# Patient Record
Sex: Female | Born: 1950
Health system: Southern US, Community
[De-identification: ages and names within clinical notes are randomized; demographics above are authoritative.]

## PROBLEM LIST (undated history)

## (undated) DIAGNOSIS — F32A Depression, unspecified: Secondary | ICD-10-CM

## (undated) DIAGNOSIS — E119 Type 2 diabetes mellitus without complications: Secondary | ICD-10-CM

## (undated) DIAGNOSIS — F419 Anxiety disorder, unspecified: Secondary | ICD-10-CM

## (undated) DIAGNOSIS — I10 Essential (primary) hypertension: Secondary | ICD-10-CM

## (undated) DIAGNOSIS — E785 Hyperlipidemia, unspecified: Secondary | ICD-10-CM

## (undated) HISTORY — PX: TUBAL LIGATION: SHX77

## (undated) HISTORY — PX: EYE SURGERY: SHX253

## (undated) HISTORY — PX: CARPAL TUNNEL RELEASE: SHX101

---

## 1998-01-10 ENCOUNTER — Emergency Department (HOSPITAL_COMMUNITY): Admission: EM | Admit: 1998-01-10 | Discharge: 1998-01-10 | Payer: Self-pay | Admitting: Emergency Medicine

## 2009-12-08 ENCOUNTER — Emergency Department (HOSPITAL_COMMUNITY): Admission: EM | Admit: 2009-12-08 | Discharge: 2009-12-08 | Payer: Self-pay | Admitting: Emergency Medicine

## 2020-01-22 ENCOUNTER — Other Ambulatory Visit: Payer: Self-pay | Admitting: Physician Assistant

## 2020-01-22 ENCOUNTER — Ambulatory Visit
Admission: RE | Admit: 2020-01-22 | Discharge: 2020-01-22 | Disposition: A | Discharge status: Home / Self Care | Payer: Medicare HMO | Source: Ambulatory Visit | Attending: Physician Assistant | Admitting: Physician Assistant

## 2020-01-22 ENCOUNTER — Other Ambulatory Visit: Payer: Self-pay

## 2020-01-22 DIAGNOSIS — J387 Other diseases of larynx: Secondary | ICD-10-CM

## 2020-01-22 MED ORDER — IOPAMIDOL (ISOVUE-300) INJECTION 61%
75.0000 mL | Freq: Once | INTRAVENOUS | Status: AC | PRN
  Administered 2020-01-22: 75 mL via INTRAVENOUS

## 2020-01-27 ENCOUNTER — Encounter (HOSPITAL_COMMUNITY): Payer: Self-pay | Admitting: Otolaryngology

## 2020-01-27 ENCOUNTER — Other Ambulatory Visit (HOSPITAL_COMMUNITY)
Admission: RE | Admit: 2020-01-27 | Discharge: 2020-01-27 | Disposition: A | Discharge status: Home / Self Care | Payer: Medicare HMO | Source: Ambulatory Visit | Attending: Otolaryngology | Admitting: Otolaryngology

## 2020-01-27 DIAGNOSIS — Z01812 Encounter for preprocedural laboratory examination: Secondary | ICD-10-CM | POA: Insufficient documentation

## 2020-01-27 DIAGNOSIS — Z20822 Contact with and (suspected) exposure to covid-19: Secondary | ICD-10-CM | POA: Diagnosis not present

## 2020-01-27 LAB — SARS CORONAVIRUS 2 (TAT 6-24 HRS): SARS Coronavirus 2: NEGATIVE

## 2020-01-27 NOTE — Progress Notes (Signed)
PCP - Dr Anastasia Pall Cardiologist - n/a  Chest x-ray - n/a EKG - DOS 01/28/20 Stress Test - n/a ECHO - n/a Cardiac Cath - n/a  Fasting Blood Sugar - Unknown Checks Blood Sugar ___0__ times a day  DM 2 - Patient denies this DX.  STOP now taking any Aspirin (unless otherwise instructed by your surgeon), Aleve, Naproxen, Ibuprofen, Motrin, Advil, Goody's, BC's, all herbal medications, fish oil, and all vitamins.   Coronavirus Screening Covid test scheduled 01/27/20 Do you have any of the following symptoms:  Cough yes/no: No Fever (>100.1F)  yes/no: No Runny nose yes/no: No Sore throat yes/no: No Difficulty breathing/shortness of breath  yes/no: No  Have you traveled in the last 14 days and where? yes/no: No  Patient verbalized understanding of instructions that were given via phone.

## 2020-01-27 NOTE — H&P (Signed)
HPI:   Chief Complaint  Patient presents with  . Sore Throat  Patient is here for sore throat since april 2021.   Sara Farmer is a 69 y.o. female who presents as a new patient for sore throat. Sore throat began four months ago as a new problem; no antecedent event she can recall. Course is stable. Medical therapy has included Nystatin mouth wash and Diflucan for oral thrush. She developed sinus pressure a few weeks ago and was prescribed a course of Doxycycline. Symptoms improved marginally but have not resolved. She did see Dr. Jannifer Franklin with [facility]; cannot recall the medication that was prescribed which she did take (no improvement). She has discomfort in her throat with swallowing but no dysphagia, oral regurgitation, chronic cough or hemoptysis. She reports in recent weeks becoming hoarse. For years has experienced regular mucous feeling in her throat and excessive throat clearing. Her ears are also hurting; fluctuating from one side to the other. She denies a history of heartburn or indigestion. No recent imaging of head or neck.  PMH of type II DM, non-insulin dependent. Currently not on medical therapy. She actually denies that she is a diabetic. Her last A1c was 6.9% on 12/22/19. She rarely uses her Albuterol inhaler; prescribed in January 2021 for acute bacterial bronchitis. No steroid inhalers.   No PMH of stroke. No anticoagulants. No bleeding disorder or history of adverse reaction to anesthesia.  Current smoker (60 year history; has not smoked 2ppd in at least one year; now about 10 cigarettes per day). Drinks about 4-5 caffeinated beverages per day.  PMH/Meds/All/SocHx/FamHx/ROS:   Past Medical History:  Diagnosis Date  . High cholesterol  . Hypertension   Past Surgical History:  Procedure Laterality Date  . CARPAL TUNNEL RELEASE Bilateral   No family history of bleeding disorders, wound healing problems or difficulty with anesthesia.   Social History   Socioeconomic  History  . Marital status: Married  Spouse name: Not on file  . Number of children: Not on file  . Years of education: Not on file  . Highest education level: Not on file  Occupational History  . Not on file  Tobacco Use  . Smoking status: Current Every Day Smoker  Packs/day: 0.50  Types: Cigarettes  . Smokeless tobacco: Never Used  Substance and Sexual Activity  . Alcohol use: Not Currently  . Drug use: Not on file  . Sexual activity: Not on file  Other Topics Concern  . Not on file  Social History Narrative  . Not on file   Social Determinants of Health   Financial Resource Strain:  . Difficulty of Paying Living Expenses:  Food Insecurity:  . Worried About Charity fundraiser in the Last Year:  . Arboriculturist in the Last Year:  Transportation Needs:  . Film/video editor (Medical):  Marland Kitchen Lack of Transportation (Non-Medical):  Physical Activity:  . Days of Exercise per Week:  . Minutes of Exercise per Session:  Stress:  . Feeling of Stress :  Social Connections:  . Frequency of Communication with Friends and Family:  . Frequency of Social Gatherings with Friends and Family:  . Attends Religious Services:  . Active Member of Clubs or Organizations:  . Attends Archivist Meetings:  Marland Kitchen Marital Status:   Current Outpatient Medications:  . albuterol 90 mcg/actuation inhaler, Inhale into the lungs., Disp: , Rfl:  . amLODIPine (NORVASC) 10 MG tablet, TAKE 1 TABLET BY MOUTH EVERY DAY, Disp: , Rfl:  .  fluticasone propionate (FLONASE) 50 mcg/actuation nasal spray, 1 spray by Nasal route., Disp: , Rfl:  . hydrocortisone 2.5 % cream, Apply to affected area 2 times daily, Disp: , Rfl:  . PARoxetine (PAXIL) 40 MG tablet, TAKE ONE TABLET BY MOUTH IN THE MORNING., Disp: , Rfl:  . simvastatin (ZOCOR) 40 MG tablet, TAKE 1 TABLET AT BEDTIME, Disp: , Rfl:  . valsartan-hydrochlorothiazide (DIOVAN-HCT) 320-25 mg per tablet, Take by mouth., Disp: , Rfl:   A complete ROS  was performed with pertinent positives/negatives noted in the HPI. The remainder of the ROS are negative.   Physical Exam:   BP 142/85  Pulse 87  Temp 96.8 F (36 C)  Ht 1.524 m (5')  Wt 90.3 kg (199 lb)  BMI 38.86 kg/m   General Awake, at baseline alertness during examination.  Eyes No scleral icterus or conjunctival hemorrhage. Globe position appears normal. EOMI.  Right Ear EAC patent, TM intact w/o inflammation. Middle ear well aerated.  Left Ear EAC patent, TM intact w/o inflammation. Middle ear well aerated.  Nose Patent, no polyps or masses seen on anterior rhinoscopy.  Oral cavity Dentures upper (removed). No mucosal lesions or tumors seen. Tongue midline. No evidence of oral thrush.  Oropharynx Symmetric tonsils.  Neck No abnormal cervical lymphadenopathy. No thyromegaly. No thyroid masses palpated.  Cardio-vascular No cyanosis.  Pulmonary No audible stridor. Breathing easily with no labor.  Neuro Symmetric facial movement.  Psychiatry Appropriate affect and mood for clinic visit.   Independent Review of Additional Tests or Records:  Medical records.   Procedures:   Flexible Laryngoscopy Procedure Note:  Date: 01/22/20  Indications: chronic sore throat, hoarseness, referred otalgia.   Risks, benefits and clinical relevance of the study were discussed. The patient understands and agrees to proceed.   Procedure: After adequate topical anesthetic was applied, 4 mm flexible laryngoscope was passed through the left nasal cavity without difficulty. Flexible laryngoscopy shows patent anterior nasal cavity with minimal crusting, no discharge or infection.  Normal base of tongue. Right anterior supraglottic area is more prominent versus the left, concerning for mass. Posterior mucosal surface of epiglottis is irregular. 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 No obstruction of the airway. Normal vocal cord mobility. Hypopharynx normal without mass, pooling of secretions or  aspiration.  Patient tolerated procedure without complication or difficulty.

## 2020-01-27 NOTE — Anesthesia Preprocedure Evaluation (Addendum)
Anesthesia Evaluation  Patient identified by MRN, date of birth, ID band Patient awake    Reviewed: Allergy & Precautions, NPO status , Patient's Chart, lab work & pertinent test results  Airway Mallampati: II  TM Distance: >3 FB Neck ROM: Full    Dental no notable dental hx. (+) Edentulous Upper, Poor Dentition, Missing,    Pulmonary Current Smoker,  Supraglottic mass Still smoking, has cut back recently to <1/2ppd. Prior to this diagnosis has been smoking 1ppd x many years, 21 pack year history    Pulmonary exam normal breath sounds clear to auscultation       Cardiovascular hypertension, Normal cardiovascular exam Rhythm:Regular Rate:Normal     Neuro/Psych PSYCHIATRIC DISORDERS Anxiety Depression negative neurological ROS     GI/Hepatic negative GI ROS, Neg liver ROS,   Endo/Other  neg diabetes  Renal/GU negative Renal ROS  negative genitourinary   Musculoskeletal negative musculoskeletal ROS (+)   Abdominal (+) + obese,   Peds  Hematology negative hematology ROS (+)   Anesthesia Other Findings   Reproductive/Obstetrics negative OB ROS                            Anesthesia Physical Anesthesia Plan  ASA: III  Anesthesia Plan: General   Post-op Pain Management:    Induction: Intravenous  PONV Risk Score and Plan: 4 or greater and Ondansetron, Dexamethasone, Midazolam and Treatment may vary due to age or medical condition  Airway Management Planned: Oral ETT  Additional Equipment: None  Intra-op Plan:   Post-operative Plan: Extubation in OR  Informed Consent: I have reviewed the patients History and Physical, chart, labs and discussed the procedure including the risks, benefits and alternatives for the proposed anesthesia with the patient or authorized representative who has indicated his/her understanding and acceptance.     Dental advisory given  Plan Discussed with:  CRNA  Anesthesia Plan Comments:        Anesthesia Quick Evaluation

## 2020-01-28 ENCOUNTER — Encounter (HOSPITAL_COMMUNITY)
Admission: RE | Disposition: A | Discharge status: Home / Self Care | Payer: Self-pay | Source: Home / Self Care | Attending: Otolaryngology

## 2020-01-28 ENCOUNTER — Other Ambulatory Visit: Payer: Self-pay

## 2020-01-28 ENCOUNTER — Encounter (HOSPITAL_COMMUNITY): Payer: Self-pay | Admitting: Otolaryngology

## 2020-01-28 ENCOUNTER — Ambulatory Visit (HOSPITAL_COMMUNITY): Payer: Medicare HMO | Admitting: Anesthesiology

## 2020-01-28 ENCOUNTER — Ambulatory Visit (HOSPITAL_COMMUNITY)
Admission: RE | Admit: 2020-01-28 | Discharge: 2020-01-28 | Disposition: A | Discharge status: Home / Self Care | Payer: Medicare HMO | Attending: Otolaryngology | Admitting: Otolaryngology

## 2020-01-28 DIAGNOSIS — E7800 Pure hypercholesterolemia, unspecified: Secondary | ICD-10-CM | POA: Insufficient documentation

## 2020-01-28 DIAGNOSIS — Z79899 Other long term (current) drug therapy: Secondary | ICD-10-CM | POA: Diagnosis not present

## 2020-01-28 DIAGNOSIS — R490 Dysphonia: Secondary | ICD-10-CM | POA: Insufficient documentation

## 2020-01-28 DIAGNOSIS — E119 Type 2 diabetes mellitus without complications: Secondary | ICD-10-CM | POA: Insufficient documentation

## 2020-01-28 DIAGNOSIS — J387 Other diseases of larynx: Secondary | ICD-10-CM | POA: Diagnosis present

## 2020-01-28 DIAGNOSIS — J029 Acute pharyngitis, unspecified: Secondary | ICD-10-CM | POA: Diagnosis not present

## 2020-01-28 DIAGNOSIS — J381 Polyp of vocal cord and larynx: Secondary | ICD-10-CM | POA: Diagnosis not present

## 2020-01-28 DIAGNOSIS — F17210 Nicotine dependence, cigarettes, uncomplicated: Secondary | ICD-10-CM | POA: Diagnosis not present

## 2020-01-28 DIAGNOSIS — I10 Essential (primary) hypertension: Secondary | ICD-10-CM | POA: Diagnosis not present

## 2020-01-28 DIAGNOSIS — C321 Malignant neoplasm of supraglottis: Secondary | ICD-10-CM | POA: Insufficient documentation

## 2020-01-28 HISTORY — PX: DIRECT LARYNGOSCOPY: SHX5326

## 2020-01-28 HISTORY — DX: Depression, unspecified: F32.A

## 2020-01-28 HISTORY — DX: Anxiety disorder, unspecified: F41.9

## 2020-01-28 HISTORY — DX: Essential (primary) hypertension: I10

## 2020-01-28 HISTORY — DX: Type 2 diabetes mellitus without complications: E11.9

## 2020-01-28 HISTORY — PX: RIGID ESOPHAGOSCOPY: SHX5226

## 2020-01-28 HISTORY — DX: Hyperlipidemia, unspecified: E78.5

## 2020-01-28 LAB — BASIC METABOLIC PANEL
Anion gap: 17 — ABNORMAL HIGH (ref 5–15)
BUN: 25 mg/dL — ABNORMAL HIGH (ref 8–23)
CO2: 24 mmol/L (ref 22–32)
Calcium: 9.5 mg/dL (ref 8.9–10.3)
Chloride: 96 mmol/L — ABNORMAL LOW (ref 98–111)
Creatinine, Ser: 1.14 mg/dL — ABNORMAL HIGH (ref 0.44–1.00)
GFR calc Af Amer: 56 mL/min — ABNORMAL LOW (ref >60)
GFR calc non Af Amer: 49 mL/min — ABNORMAL LOW (ref >60)
Glucose, Bld: 159 mg/dL — ABNORMAL HIGH (ref 70–99)
Potassium: 2.6 mmol/L — CL (ref 3.5–5.1)
Sodium: 137 mmol/L (ref 135–145)

## 2020-01-28 LAB — CBC
HCT: 47.5 % — ABNORMAL HIGH (ref 36.0–46.0)
Hemoglobin: 15.8 g/dL — ABNORMAL HIGH (ref 12.0–15.0)
MCH: 28.9 pg (ref 26.0–34.0)
MCHC: 33.3 g/dL (ref 30.0–36.0)
MCV: 87 fL (ref 80.0–100.0)
Platelets: 362 10*3/uL (ref 150–400)
RBC: 5.46 MIL/uL — ABNORMAL HIGH (ref 3.87–5.11)
RDW: 13.4 % (ref 11.5–15.5)
WBC: 14.2 10*3/uL — ABNORMAL HIGH (ref 4.0–10.5)
nRBC: 0 % (ref 0.0–0.2)

## 2020-01-28 LAB — GLUCOSE, CAPILLARY
Glucose-Capillary: 181 mg/dL — ABNORMAL HIGH (ref 70–99)
Glucose-Capillary: 169 mg/dL — ABNORMAL HIGH (ref 70–99)

## 2020-01-28 SURGERY — LARYNGOSCOPY, DIRECT
Anesthesia: General

## 2020-01-28 MED ORDER — LIDOCAINE 2% (20 MG/ML) 5 ML SYRINGE
INTRAMUSCULAR | Status: DC | PRN
  Administered 2020-01-28: 60 mg via INTRAVENOUS

## 2020-01-28 MED ORDER — SUCCINYLCHOLINE CHLORIDE 200 MG/10ML IV SOSY
PREFILLED_SYRINGE | INTRAVENOUS | Status: DC | PRN
  Administered 2020-01-28: 100 mg via INTRAVENOUS

## 2020-01-28 MED ORDER — ROCURONIUM BROMIDE 10 MG/ML (PF) SYRINGE
PREFILLED_SYRINGE | INTRAVENOUS | Status: DC | PRN
  Administered 2020-01-28: 35 mg via INTRAVENOUS

## 2020-01-28 MED ORDER — MIDAZOLAM HCL 2 MG/2ML IJ SOLN
INTRAMUSCULAR | Status: AC
  Filled 2020-01-28: qty 2

## 2020-01-28 MED ORDER — PROPOFOL 10 MG/ML IV BOLUS
INTRAVENOUS | Status: DC | PRN
  Administered 2020-01-28: 150 mg via INTRAVENOUS

## 2020-01-28 MED ORDER — POTASSIUM CHLORIDE 10 MEQ/100ML IV SOLN
INTRAVENOUS | Status: AC
  Administered 2020-01-28: 10 meq via INTRAVENOUS
  Filled 2020-01-28: qty 200

## 2020-01-28 MED ORDER — LACTATED RINGERS IV SOLN: INTRAVENOUS | Status: DC

## 2020-01-28 MED ORDER — FENTANYL CITRATE (PF) 250 MCG/5ML IJ SOLN
INTRAMUSCULAR | Status: AC
  Filled 2020-01-28: qty 5

## 2020-01-28 MED ORDER — PROPOFOL 10 MG/ML IV BOLUS
INTRAVENOUS | Status: AC
  Filled 2020-01-28: qty 20

## 2020-01-28 MED ORDER — FENTANYL CITRATE (PF) 100 MCG/2ML IJ SOLN
INTRAMUSCULAR | Status: DC | PRN
Start: 2020-01-28 — End: 2020-01-28
  Administered 2020-01-28: 50 ug via INTRAVENOUS
  Administered 2020-01-28: 100 ug via INTRAVENOUS
  Administered 2020-01-28: 50 ug via INTRAVENOUS

## 2020-01-28 MED ORDER — LIDOCAINE-EPINEPHRINE 1 %-1:100000 IJ SOLN
INTRAMUSCULAR | Status: AC
  Filled 2020-01-28: qty 1

## 2020-01-28 MED ORDER — POTASSIUM CHLORIDE 10 MEQ/100ML IV SOLN
10.0000 meq | INTRAVENOUS | Status: AC
  Administered 2020-01-28: 10 meq via INTRAVENOUS

## 2020-01-28 MED ORDER — EPINEPHRINE HCL (NASAL) 0.1 % NA SOLN
NASAL | Status: AC
  Filled 2020-01-28: qty 30

## 2020-01-28 MED ORDER — ORAL CARE MOUTH RINSE: 15.0000 mL | Freq: Once | OROMUCOSAL | Status: AC

## 2020-01-28 MED ORDER — PHENYLEPHRINE 40 MCG/ML (10ML) SYRINGE FOR IV PUSH (FOR BLOOD PRESSURE SUPPORT)
PREFILLED_SYRINGE | INTRAVENOUS | Status: DC | PRN
  Administered 2020-01-28: 40 ug via INTRAVENOUS

## 2020-01-28 MED ORDER — 0.9 % SODIUM CHLORIDE (POUR BTL) OPTIME
TOPICAL | Status: DC | PRN
  Administered 2020-01-28: 1000 mL

## 2020-01-28 MED ORDER — ACETAMINOPHEN 500 MG PO TABS
1000.0000 mg | ORAL_TABLET | Freq: Once | ORAL | Status: AC
  Administered 2020-01-28: 1000 mg via ORAL
  Filled 2020-01-28: qty 2

## 2020-01-28 MED ORDER — CHLORHEXIDINE GLUCONATE 0.12 % MT SOLN: 15.0000 mL | Freq: Once | OROMUCOSAL | Status: AC

## 2020-01-28 MED ORDER — POTASSIUM CHLORIDE 10 MEQ/100ML IV SOLN: 10.0000 meq | INTRAVENOUS | Status: DC

## 2020-01-28 MED ORDER — CHLORHEXIDINE GLUCONATE 0.12 % MT SOLN
OROMUCOSAL | Status: AC
  Administered 2020-01-28: 15 mL via OROMUCOSAL
  Filled 2020-01-28: qty 15

## 2020-01-28 MED ORDER — ONDANSETRON HCL 4 MG/2ML IJ SOLN
INTRAMUSCULAR | Status: DC | PRN
  Administered 2020-01-28: 4 mg via INTRAVENOUS

## 2020-01-28 MED ORDER — SUGAMMADEX SODIUM 200 MG/2ML IV SOLN
INTRAVENOUS | Status: DC | PRN
  Administered 2020-01-28: 200 mg via INTRAVENOUS

## 2020-01-28 MED ORDER — EPINEPHRINE HCL (NASAL) 0.1 % NA SOLN
NASAL | Status: DC | PRN
  Administered 2020-01-28: 30 mL

## 2020-01-28 SURGICAL SUPPLY — 26 items
BALLN PULM 15 16.5 18X75 (BALLOONS)
BALLOON PULM 15 16.5 18X75 (BALLOONS) IMPLANT
CANISTER SUCT 3000ML PPV (MISCELLANEOUS) ×2 IMPLANT
COVER BACK TABLE 60X90IN (DRAPES) ×2 IMPLANT
COVER MAYO STAND STRL (DRAPES) ×2 IMPLANT
COVER WAND RF STERILE (DRAPES) IMPLANT
DRAPE HALF SHEET 40X57 (DRAPES) ×2 IMPLANT
GAUZE 4X4 16PLY RFD (DISPOSABLE) ×2 IMPLANT
GAUZE SPONGE 4X4 12PLY STRL (GAUZE/BANDAGES/DRESSINGS) IMPLANT
GLOVE ECLIPSE 7.5 STRL STRAW (GLOVE) ×2 IMPLANT
GUARD TEETH (MISCELLANEOUS) IMPLANT
KIT BASIN OR (CUSTOM PROCEDURE TRAY) ×2 IMPLANT
KIT TURNOVER KIT B (KITS) ×2 IMPLANT
MARKER SKIN DUAL TIP RULER LAB (MISCELLANEOUS) ×2 IMPLANT
NEEDLE PRECISIONGLIDE 27X1.5 (NEEDLE) IMPLANT
NS IRRIG 1000ML POUR BTL (IV SOLUTION) ×2 IMPLANT
PAD ARMBOARD 7.5X6 YLW CONV (MISCELLANEOUS) ×4 IMPLANT
PATTIES SURGICAL .5 X3 (DISPOSABLE) ×2 IMPLANT
SOL ANTI FOG 6CC (MISCELLANEOUS) IMPLANT
SOLUTION ANTI FOG 6CC (MISCELLANEOUS)
SYR CONTROL 10ML LL (SYRINGE) IMPLANT
SYR TB 1ML LUER SLIP (SYRINGE) IMPLANT
TOWEL GREEN STERILE (TOWEL DISPOSABLE) ×2 IMPLANT
TOWEL GREEN STERILE FF (TOWEL DISPOSABLE) ×2 IMPLANT
TUBE CONNECTING 12X1/4 (SUCTIONS) ×2 IMPLANT
WATER STERILE IRR 1000ML POUR (IV SOLUTION) ×2 IMPLANT

## 2020-01-28 NOTE — Anesthesia Postprocedure Evaluation (Signed)
Anesthesia Post Note  Patient: LAIYLA SLAGEL  Procedure(s) Performed: DIRECT LARYNGOSCOPY WITH BIOPSY (N/A ) RIGID ESOPHAGOSCOPY (N/A )     Patient location during evaluation: PACU Anesthesia Type: General Level of consciousness: awake and alert, oriented and patient cooperative Pain management: pain level controlled Vital Signs Assessment: post-procedure vital signs reviewed and stable Respiratory status: spontaneous breathing, nonlabored ventilation and respiratory function stable Cardiovascular status: blood pressure returned to baseline and stable Postop Assessment: no apparent nausea or vomiting Anesthetic complications: no   No complications documented.  Last Vitals:  Vitals:   01/28/20 0719 01/28/20 1049  BP: 132/66 (!) 135/116  Pulse: 98 87  Resp: 19 14  Temp: 36.7 C (!) 36.3 C  SpO2: 96% 95%    Last Pain:  Vitals:   01/28/20 1049  TempSrc:   PainSc: (P) 0-No pain                 [place]

## 2020-01-28 NOTE — Progress Notes (Signed)
CRITICAL VALUE ALERT  Critical Value:  K+ 2.6  Date & Time Notied:  01/28/2020 @ 3202  Provider Notified: Dr. Doroteo Glassman notified  Orders Received/Actions taken: received order for 2 runs of Potassium IV

## 2020-01-28 NOTE — Anesthesia Procedure Notes (Signed)
Procedure Name: Intubation Date/Time: 01/28/2020 10:13 AM Performed by: Imagene Riches, CRNA Pre-anesthesia Checklist: Patient identified, Emergency Drugs available, Suction available and Patient being monitored Patient Re-evaluated:Patient Re-evaluated prior to induction Oxygen Delivery Method: Circle System Utilized Preoxygenation: Pre-oxygenation with 100% oxygen Induction Type: IV induction Ventilation: Mask ventilation without difficulty and Oral airway inserted - appropriate to patient size Laryngoscope Size: Sabra Heck and 2 Grade View: Grade I Tube type: Oral Tube size: 7.0 mm Number of attempts: 1 Airway Equipment and Method: Stylet and Oral airway Placement Confirmation: ETT inserted through vocal cords under direct vision,  positive ETCO2 and breath sounds checked- equal and bilateral Secured at: 21 cm Tube secured with: Tape Dental Injury: Teeth and Oropharynx as per pre-operative assessment

## 2020-01-28 NOTE — Progress Notes (Signed)
Ring delivered to husband, Ronalee Belts, per patient's request.

## 2020-01-28 NOTE — Interval H&P Note (Signed)
History and Physical Interval Note:  01/28/2020 8:22 AM  Sara Farmer  has presented today for surgery, with the diagnosis of supraglotic mass.  The various methods of treatment have been discussed with the patient and family. After consideration of risks, benefits and other options for treatment, the patient has consented to  Procedure(s): DIRECT LARYNGOSCOPY W/BIOPSY (N/A) RIGID ESOPHAGOSCOPY (N/A) as a surgical intervention.  The patient's history has been reviewed, patient examined, no change in status, stable for surgery.  I have reviewed the patient's chart and labs.  Questions were answered to the patient's satisfaction.     Izora Gala

## 2020-01-28 NOTE — Op Note (Signed)
OPERATIVE REPORT  DATE OF SURGERY: 01/28/2020  PATIENT:  Sara Farmer,  69 y.o. female  PRE-OPERATIVE DIAGNOSIS:  supraglotic mass  POST-OPERATIVE DIAGNOSIS:  supraglotic mass  PROCEDURE:  Procedure(s): DIRECT LARYNGOSCOPY WITH BIOPSY RIGID ESOPHAGOSCOPY  SURGEON:  Beckie Salts, MD  ASSISTANTS: None  ANESTHESIA:   General   EBL: 20 ml  DRAINS: None  LOCAL MEDICATIONS USED:  None  SPECIMEN: 1.  Left vocal cord polyp 2.  Right supraglottic mass biopsy  COUNTS:  Correct  PROCEDURE DETAILS: The patient was taken to the operating room and placed on the operating table in the supine position. Following induction of general endotracheal anesthesia, the table was turned 90 degrees and the patient was draped in standard fashion for upper endoscopy.  1.  Rigid cervical esophagoscopy.  Rigid cervical esophagoscope was entered into the oral cavity passed into the esophagus and advanced through the cervical esophagus.  Slowly withdrawn while circumferentially evaluating the mucosal surfaces.  Secretions were suctioned.  No lesions were identified.  The scope was removed.  2.  Microlaryngoscopy with excision of left vocal cord polyp and biopsy of right supraglottic mass.  A Jako laryngoscope was used to evaluate the larynx and hypopharynx.  There is a large benign-appearing polyp of the left vocal cord.  There is a smaller polyp on the right.  The right side was immediately adjacent to the supraglottic mass of the right side was left alone.  The scope was attached to the [place] stand with the suspension apparatus and the operating microscope was used for the next part of the procedure.  Using microlaryngoscopy up-biting scissors the superior cord mucosa was incised.  The polyp was elongated with a relatively narrow attachment to the membranous cord and the entire polyp was resected using the microlaryngoscopy scissors and sent for pathological evaluation.  There is reasonable apposition of  the mucosal edges. The right supraglottic mass was then identified.  It was ulcerated and partially fungating and started at the floor of the right ventricle posteriorly along the AE fold, not extending to the superior edge of the AE fold and not involving the lateral aspect.  It did extend to the anterior commissure as well and into the petiole of the epiglottis.  Multiple biopsies were taken and sent for pathologic evaluation.  Topical adrenaline on pledgets was used for hemostasis.  The scope was removed and the patient was awakened extubated and transferred to recovery in stable condition.    PATIENT DISPOSITION:  To PACU, stable

## 2020-01-28 NOTE — Discharge Instructions (Addendum)
Avoid smoking, avoid straining your voice, avoid screaming or whispering.

## 2020-01-28 NOTE — Transfer of Care (Signed)
Immediate Anesthesia Transfer of Care Note  Patient: Sara Farmer  Procedure(s) Performed: DIRECT LARYNGOSCOPY WITH BIOPSY (N/A ) RIGID ESOPHAGOSCOPY (N/A )  Patient Location: PACU  Anesthesia Type:General  Level of Consciousness: awake and alert   Airway & Oxygen Therapy: Patient Spontanous Breathing and Patient connected to nasal cannula oxygen  Post-op Assessment: Report given to RN and Post -op Vital signs reviewed and stable  Post vital signs: Reviewed and stable  Last Vitals:  Vitals Value Taken Time  BP 136/96 01/28/20 1051  Temp 36.3 C 01/28/20 1049  Pulse 78 01/28/20 1053  Resp 16 01/28/20 1053  SpO2 96 % 01/28/20 1053  Vitals shown include unvalidated device data.  Last Pain:  Vitals:   01/28/20 0802  TempSrc:   PainSc: 0-No pain      Patients Stated Pain Goal: 0 (72/55/00 1642)  Complications: No complications documented.

## 2020-01-29 ENCOUNTER — Encounter (HOSPITAL_COMMUNITY): Payer: Self-pay | Admitting: Otolaryngology

## 2020-02-04 ENCOUNTER — Other Ambulatory Visit (HOSPITAL_COMMUNITY): Payer: Self-pay | Admitting: Otolaryngology

## 2020-02-04 ENCOUNTER — Other Ambulatory Visit: Payer: Self-pay | Admitting: Otolaryngology

## 2020-02-04 DIAGNOSIS — C329 Malignant neoplasm of larynx, unspecified: Secondary | ICD-10-CM

## 2020-02-06 ENCOUNTER — Telehealth: Payer: Self-pay | Admitting: Oncology

## 2020-02-06 NOTE — Telephone Encounter (Signed)
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 Sara Farmer has been cld and scheduled to see Dr. Benay Spice on 9/14 at 2pm. Appt date and time has been given to the pt's spouse.

## 2020-02-13 NOTE — Progress Notes (Signed)
Oncology Nurse Navigator Documentation  Placed introductory call to new referral patient Sara Farmer  Introduced myself as the H&N oncology nurse navigator that works with Dr. Benay Spice and Dr. Isidore Moos to whom she has been referred by Dr. Constance Holster.  She confirmed understanding of referral.  Briefly explained my role as her navigator, provided my contact information.   Confirmed understanding of upcoming appts and [place] location, explained arrival and registration process.  I explained the purpose of a dental evaluation prior to starting RT, indicated she would be contacted by [facility] to arrange an appt.    I encouraged her to call with questions/concerns as she moves forward with appts and procedures.    She verbalized understanding of information provided, expressed appreciation for my call.   Navigator Initial Assessment . Employment Status:She is retired . Currently on FMLA / STD: na . Living Situation: She lives with her husband . Support System: Husband . PCP: . PCD: none. She is aware that she will be contacted by Dr. Enrique Sack . Financial Concerns: not at this time. . Transportation Needs: no . Sensory Deficits:no . Language Barriers/Interpreter Needed:  no . Ambulation Needs: no . DME Used in Home: no . Psychosocial Needs:  no . Concerns/Needs Understanding Cancer:  addressed/answered by navigator to best of ability . Self-Expressed Needs: no   Harlow Asa RN, BSN, OCN Head & Neck Oncology Nurse [place] at [facility] Phone # [phone]  Fax # [phone]

## 2020-02-16 ENCOUNTER — Encounter (HOSPITAL_COMMUNITY)
Admission: RE | Admit: 2020-02-16 | Discharge: 2020-02-16 | Disposition: A | Discharge status: Home / Self Care | Payer: Medicare HMO | Source: Ambulatory Visit | Attending: Otolaryngology | Admitting: Otolaryngology

## 2020-02-16 ENCOUNTER — Other Ambulatory Visit: Payer: Self-pay

## 2020-02-16 DIAGNOSIS — K760 Fatty (change of) liver, not elsewhere classified: Secondary | ICD-10-CM | POA: Insufficient documentation

## 2020-02-16 DIAGNOSIS — I700 Atherosclerosis of aorta: Secondary | ICD-10-CM | POA: Diagnosis not present

## 2020-02-16 DIAGNOSIS — C329 Malignant neoplasm of larynx, unspecified: Secondary | ICD-10-CM

## 2020-02-16 DIAGNOSIS — K8020 Calculus of gallbladder without cholecystitis without obstruction: Secondary | ICD-10-CM | POA: Diagnosis not present

## 2020-02-16 DIAGNOSIS — J439 Emphysema, unspecified: Secondary | ICD-10-CM | POA: Insufficient documentation

## 2020-02-16 LAB — GLUCOSE, CAPILLARY: Glucose-Capillary: 145 mg/dL — ABNORMAL HIGH (ref 70–99)

## 2020-02-16 MED ORDER — FLUDEOXYGLUCOSE F - 18 (FDG) INJECTION
9.8800 | Freq: Once | INTRAVENOUS | Status: AC | PRN
  Administered 2020-02-16: 9.88 via INTRAVENOUS

## 2020-02-17 ENCOUNTER — Other Ambulatory Visit: Payer: Self-pay

## 2020-02-17 ENCOUNTER — Inpatient Hospital Stay: Payer: Medicare HMO | Attending: Oncology | Admitting: Oncology

## 2020-02-17 VITALS — BP 147/97 | HR 92 | Temp 98.2°F | Resp 17 | Ht 60.0 in | Wt 196.2 lb

## 2020-02-17 DIAGNOSIS — F17210 Nicotine dependence, cigarettes, uncomplicated: Secondary | ICD-10-CM

## 2020-02-17 DIAGNOSIS — I10 Essential (primary) hypertension: Secondary | ICD-10-CM | POA: Diagnosis not present

## 2020-02-17 DIAGNOSIS — F329 Major depressive disorder, single episode, unspecified: Secondary | ICD-10-CM | POA: Diagnosis not present

## 2020-02-17 DIAGNOSIS — E042 Nontoxic multinodular goiter: Secondary | ICD-10-CM | POA: Diagnosis not present

## 2020-02-17 DIAGNOSIS — J449 Chronic obstructive pulmonary disease, unspecified: Secondary | ICD-10-CM | POA: Diagnosis not present

## 2020-02-17 DIAGNOSIS — E119 Type 2 diabetes mellitus without complications: Secondary | ICD-10-CM

## 2020-02-17 DIAGNOSIS — C321 Malignant neoplasm of supraglottis: Secondary | ICD-10-CM | POA: Diagnosis present

## 2020-02-17 DIAGNOSIS — E785 Hyperlipidemia, unspecified: Secondary | ICD-10-CM | POA: Diagnosis not present

## 2020-02-17 DIAGNOSIS — Z23 Encounter for immunization: Secondary | ICD-10-CM | POA: Insufficient documentation

## 2020-02-17 MED ORDER — INFLUENZA VAC A&B SA ADJ QUAD 0.5 ML IM PRSY
PREFILLED_SYRINGE | INTRAMUSCULAR | Status: AC
  Filled 2020-02-17: qty 0.5

## 2020-02-17 MED ORDER — INFLUENZA VAC A&B SA ADJ QUAD 0.5 ML IM PRSY
0.5000 mL | PREFILLED_SYRINGE | Freq: Once | INTRAMUSCULAR | Status: AC
  Administered 2020-02-17: 0.5 mL via INTRAMUSCULAR

## 2020-02-17 NOTE — Progress Notes (Signed)
Oncology Nurse Navigator Documentation  Met with patient during initial consult with Dr. Sherrill. She was accompanied by her husband, Mike . Further introduced myself as his/their Navigator, explained my role as a member of the Care Team. . Provided New Patient Information packet: o Contact information for physician, this navigator, other members of the Care Team o Advance Directive information ([facility] blue pamphlet with LCSW insert); provided [facility] AD booklet at his request,  o Fall Prevention Patient Safety Plan o Financial Assistance Information sheet o Symptom Management Clinic information o [facility] campus map with highlight of [facility] o SLP Information sheet . Assisted with post-consult appt scheduling. . They verbalized understanding of information provided. I encouraged them to call with questions/concerns moving forward. They are aware that I will see them again on 02/20/20 for her consult with Dr. Squire, Radiation Oncology.  Jennifer Malmfelt, RN, BSN, OCN Head & Neck Oncology Nurse Navigator [facility] at [place] [phone]  

## 2020-02-17 NOTE — Progress Notes (Signed)
[facility] Patient Consult   Requesting MD: Hildy Nicholl 69 y.o.  Oct 09, 1950    Reason for Consult: Supraglottic carcinoma   HPI: Ms. Hellstrom developed a sore throat beginning in April of this year.  Her symptoms did not improve with antifungal therapy.  She was evaluated by an ENT physician and reports no improvement after another medication was prescribed. She was prescribed doxycycline for sinus symptoms.  She developed hoarseness a few weeks prior to being referred to [facility] ENT on 01/22/2020.  A flexible laryngoscopy revealed concern for a mass in the right supraglottic region with an irregular posterior mucosal surface of the epiglottis.  Bilateral vocal cord polyps.  Normal vocal cord mobility.  No mass in the hypopharynx.  She was referred for a CT of the neck on 01/22/2020.  This revealed a soft tissue mass in the supraglottic larynx occupying the right at aryepiglottic fold and tracking across the midline to the left.  A discrete nodular component measures 16 mm.  There is involvement of the right false cord.  A portion of the right epiglottis is involved.  No involvement of the laryngeal cartilage or true cords.  Multiple hypodense thyroid nodules.  Small but conspicuous right level 3 lymph nodes measure 4-5 mm.  She was taken to the operating room by Dr. Constance Holster on 01/28/2020.  No esophageal lesions were identified.  A benign-appearing polyp was noted at the left vocal cord.  A smaller polyp was noted at the right vocal cord.  The right side was immediately adjacent to a supraglottic mass.  The left vocal cord polyp was resected.  The right supraglottic mass was ulcerated and started the floor of the right ventricle posteriorly along the AE fold.  The mass extended to the anterior commissioner and epiglottis.  Multiple biopsies were obtained.  The pathology revealed partially denuded dysplastic squamous mucosa involving the left vocal cord  polypectomy.  The supraglottic mass returned as squamous cell carcinoma.  She underwent a staging PET scan yesterday.  This confirmed a hypermetabolic 2.3 cm right supraglottic mass.  No hypermetabolic neck nodes or evidence of distant metastatic disease.    Past Medical History:  Diagnosis Date  . Anxiety   . Depression   . Diabetes mellitus without complication ([place])    Type 2 - Patient denies this DX - No meds  . HLD (hyperlipidemia)   . Hypertension     .  G2 P2  Past Surgical History:  Procedure Laterality Date  . CARPAL TUNNEL RELEASE Bilateral   . CESAREAN SECTION     x 2  . DIRECT LARYNGOSCOPY N/A 01/28/2020   Procedure: DIRECT LARYNGOSCOPY WITH BIOPSY;  Surgeon: Izora Gala, MD;  Location: [facility];  Service: ENT;  Laterality: N/A;  . EYE SURGERY Bilateral    Lasik  . RIGID ESOPHAGOSCOPY N/A 01/28/2020   Procedure: RIGID ESOPHAGOSCOPY;  Surgeon: Izora Gala, MD;  Location: [facility];  Service: ENT;  Laterality: N/A;  . TUBAL LIGATION      Medications: Reviewed  Allergies:  Allergies  Allergen Reactions  . Penicillins Rash    Trouble breathing  Childhood   . Sulfa Antibiotics Rash    Trouble breathing Childhood    Family history: Her brother had prostate cancer.  No other family history of cancer  Social History:   She lives with her husband in [place].  She worked as a Psychologist, counselling.  She smokes cigarettes.  No alcohol use.  No transfusion  history.  No risk factor for HIV or hepatitis  ROS:   Positives include: Sore throat, hoarseness  A complete ROS was otherwise negative.  Physical Exam:  Blood pressure (!) 147/97, pulse 92, temperature 98.2 F (36.8 C), temperature source Tympanic, resp. rate 17, height 5' (1.524 m), weight 196 lb 3.2 oz (89 kg), SpO2 97 %.  HEENT: Oral cavity without visible mass, upper denture plate, neck without mass Lungs: Distant breath sounds, no respiratory distress Cardiac: Regular rate and rhythm Abdomen: No  hepatosplenomegaly, no mass, nontender  Vascular: No leg edema Lymph nodes: No cervical, supraclavicular, axillary, or inguinal nodes Neurologic: Alert and oriented, the motor exam appears intact in the upper and lower extremities bilaterally Skin: No rash Musculoskeletal: No spine tenderness   LAB:  CBC  Lab Results  Component Value Date   WBC 14.2 (H) 01/28/2020   HGB 15.8 (H) 01/28/2020   HCT 47.5 (H) 01/28/2020   MCV 87.0 01/28/2020   PLT 362 01/28/2020        CMP  Lab Results  Component Value Date   NA 137 01/28/2020   K 2.6 (LL) 01/28/2020   CL 96 (L) 01/28/2020   CO2 24 01/28/2020   GLUCOSE 159 (H) 01/28/2020   BUN 25 (H) 01/28/2020   CREATININE 1.14 (H) 01/28/2020   CALCIUM 9.5 01/28/2020   GFRNONAA 49 (L) 01/28/2020   GFRAA 56 (L) 01/28/2020     No results found for: CEA1  Imaging:  NM PET Image Initial (PI) Skull Base To Thigh  Result Date: 02/16/2020 CLINICAL DATA:  Initial treatment strategy for laryngeal cancer. EXAM: NUCLEAR MEDICINE PET SKULL BASE TO THIGH TECHNIQUE: 9.9 mCi F-18 FDG was injected intravenously. Full-ring PET imaging was performed from the skull base to thigh after the radiotracer. CT data was obtained and used for attenuation correction and anatomic localization. Fasting blood glucose: 145 mg/dl COMPARISON:  01/22/2020 neck CT FINDINGS: Mediastinal blood pool activity: SUV max 4.2 Liver activity: SUV max NA NECK: There is a hypermetabolic 2.3 cm supraglottic laryngeal mass centered to the right of midline with max SUV 12.6 (series 4/image 37). No enlarged or hypermetabolic lymph nodes in the neck. Incidental CT findings: Non hypermetabolic hypodense bilateral thyroid nodules, largest 1.5 cm on the left. Mucoperiosteal thickening in the bilateral maxillary sinuses without fluid levels. CHEST: No enlarged or hypermetabolic axillary, mediastinal or hilar lymph nodes. No hypermetabolic pulmonary findings. Incidental CT findings: Atherosclerotic  nonaneurysmal thoracic aorta. Mild paraseptal emphysema. No acute consolidative airspace disease, lung masses or significant pulmonary nodules. ABDOMEN/PELVIS: No abnormal hypermetabolic activity within the liver, pancreas, adrenal glands, or spleen. No hypermetabolic lymph nodes in the abdomen or pelvis. Incidental CT findings: Cholelithiasis. Diffuse hepatic steatosis. Multiple simple right renal cysts, largest 6.9 cm superiorly. Atherosclerotic nonaneurysmal abdominal aorta. Mildly enlarged myomatous uterus with coarsely calcified right fundal uterine fibroid. SKELETON: No focal hypermetabolic activity to suggest skeletal metastasis. Incidental CT findings: none IMPRESSION: 1. Hypermetabolic 2.3 cm right supraglottic laryngeal mass compatible with known primary malignancy. 2. No hypermetabolic neck lymph nodes or distant metastatic disease. 3. Chronic findings include: Aortic Atherosclerosis (ICD10-I70.0) and Emphysema (ICD10-J43.9). Cholelithiasis. Diffuse hepatic steatosis. Electronically Signed   By: Ilona Sorrel M.D.   On: 02/16/2020 10:35      Assessment/Plan:   1. 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, clinical stage II (T2N0M0)   CT neck 01/22/2020-crescent shaped mass of the supraglottic larynx involving the right AE fold and tracking across the midline, probable involvement of the right false cord,  conspicuous right level 3 lymph nodes measuring up to 4-5 mm, 18 mm left thyroid nodule  Direct laryngoscopy with biopsy 01/28/2020-bilateral vocal cord polyps, left vocal cord polyp-partially denuded dysplastic squamous mucosa, biopsy of right supraglottic mass-squamous cell carcinoma, mass started the floor the right ventricle posteriorly along the AE fold extending to the anterior commissure and to the epiglottis  PET scan 02/16/2020-hypermetabolic 2.3 cm right supraglottic laryngeal mass, no hypermetabolic lymph nodes or distant metastases  2. Thyroid nodules on CT neck  01/22/2020-ultrasound recommended 3. Sore throat and hoarseness secondary to #1 4. Diabetes 5. Depression 6. Hypertension 7. COPD   Disposition:   Ms. Dubiel has been diagnosed with squamous cell carcinoma of the supraglottic larynx after presenting with hoarseness and a sore throat.  She appears to have an early stage tumor amenable to single modality therapy (definitive radiation or surgery).  She is scheduled to see Dr. Isidore Moos on 02/20/2020.  There is no role for systemic therapy in her case.  I discussed the probable treatment plan with Ms. Wise.  She will likely receive radiation directed at the primary tumor with prophylactic radiation to bilateral neck lymph nodes.  We discussed the potential need for a feeding tube.  She will need endoscopic follow-up with Dr. Constance Holster to evaluate the larynx for remaining tumor and vocal cord nodules.  We will request HPV testing on the supraglottic mass biopsy.  Ms. Delao is not scheduled for a follow-up visit in medical oncology.  I will be available to see her as needed.  She will continue treatment and surveillance with Drs. Isidore Moos and Constance Holster.  Ms. Mehlman received an influenza vaccine today.  Betsy Coder, MD  02/17/2020, 4:36 PM

## 2020-02-18 ENCOUNTER — Other Ambulatory Visit: Payer: Self-pay

## 2020-02-18 ENCOUNTER — Telehealth: Payer: Self-pay | Admitting: Oncology

## 2020-02-18 ENCOUNTER — Encounter: Payer: Self-pay | Admitting: General Practice

## 2020-02-18 NOTE — Progress Notes (Signed)
[place] Psychosocial Distress Screening Clinical Social Work  Clinical Social Work was referred by distress screening protocol.  The patient scored a 5 on the Psychosocial Distress Thermometer which indicates moderate distress. Clinical Social Worker contacted patient by phone to assess for distress and other psychosocial needs. Was encouarged by visit w Dr Benay Spice "my cancer was not as bad as my brain was telling me."  She was very anxious prior to getting her test results, "he answered my questions perfectly."     [facility] Initial Psychosocial Assessment Clinical Social Work  Clinical Social Work contacted by phone to assess psychosocial, emotional, mental health, and spiritual needs of the patient.   Barriers to care/review of distress screen:  - Transportation:  Do you anticipate any problems getting to appointments?  Do you have someone who can help run errands for you if you need it? She drives and husband can drive also - Help at home:  What is your living situation (alone, family, other)?  If you are physically unable to care for yourself, who would you call on to help you?  Lives w husband who is able to help as needed.   - Support system:  What does your support system look like?  Who would you call on if you needed some kind of practical help?  What if you needed someone to talk to for emotional support?  Husband is involved and supportive - Finances:  Are you concerned about finances.  Considering returning to work?  If not, applying for disability?  On limited income, husband works.  Makes the money stretch.  "If Im tight, I went on Food Stamps before."  Resourceful.  Advised that she can ask for food bag from food pantry at each visit if desired.    What is your understanding of where you are with your cancer? Its cause?  Your treatment plan and what happens next?  Newly diagnosed w Stage 2 supraglottic cancer.  Will have radiation therapy, does not need chemotherapy.  Has met w Dr Benay Spice  and Harlow Asa, nurse navigator.  Feels reassured after meeting w oncologist. She will meet w Dr Isidore Moos and treatment plan will be established at that time.   What are your worries for the future as you begin treatment for cancer?  None at this time - she feels very supported by [facility] team and will reach out as needed.    CSW Summary:  Patient and family psychosocial functioning including strengths, limitations, and coping skills:  69 year old married female, newly diagnosed w supraglottis cancer.  Had sore throat since Spring 2021, further evaluation revealed cancer.  She is grateful that cancer was caught early enough to be treatable.  She appreciates the support she has received from the [facility] team throughout her care here.  She lives w husband, is used to making the most of whatever resources she has.  She is retired from the nursing profession, husband returned to work a couple of years ago to increase their income.  CSW and patient discussed common feeling and emotions when being diagnosed with cancer, and the importance of support during treatment. CSW informed patient of the support team and support services at [facility]. CSW provided contact information and encouraged patient to call with any questions or concerns.  Will see patient in Head and Neck M[facility] when scheduled.    Identifications of barriers to care:  None noted  Availability of community resources:  [place] as needed  Clinical Social Worker follow up  needed: Yes.    [facility] follow up  [facility] 02/17/2020  Screening Type Initial Screening  Distress experienced in past week (1-10) 5  Emotional problem type Nervousness/Anxiety;Adjusting to illness  Information Concerns Type Lack of info about diagnosis;Lack of info about treatment  Physical Problem type Sleep/insomnia  Physician notified of physical symptoms Yes  Referral to clinical psychology No  Referral to clinical social work Yes  Referral to dietition  No  Referral to financial advocate No  Referral to support programs No  Referral to palliative care No    Beverely Pace, [place], [place] Worker Phone:  [phone]

## 2020-02-18 NOTE — Progress Notes (Signed)
Oncology Nurse Navigator Documentation  At the request of Dr. Benay Spice I contacted [facility], and spoke with Amber. I requested HPV testing be added to Ms. Aber's 01/28/20 Biopsy of her Supraglottic. She verbalized that she would get it completed as requested.  Harlow Asa RN, BSN, OCN Head & Neck Oncology Nurse [place] at [facility] Phone # [phone]  Fax # [phone]

## 2020-02-18 NOTE — Telephone Encounter (Signed)
No appointments scheduled per 9/14 los.

## 2020-02-19 LAB — SURGICAL PATHOLOGY

## 2020-02-19 NOTE — Progress Notes (Signed)
Head and Neck Cancer Location of Tumor / Histology:  Squamous cell carcinoma RIGHT supraglottis  Patient presented with symptoms of: a sore throat beginning in April of this year.  Her symptoms did not improve with antifungal therapy.  She was evaluated by an ENT physician and reports no improvement after another medication was prescribed. She was prescribed doxycycline for sinus symptoms.  She developed hoarseness a few weeks prior to being referred to [facility] ENT on 01/22/2020.  A flexible laryngoscopy revealed concern for a mass in the right supraglottic region with an irregular posterior mucosal surface of the epiglottis.  Bilateral vocal cord polyps  Biopsies revealed:  01/28/2020 FINAL MICROSCOPIC DIAGNOSIS:  A. VOCAL CORD, LEFT, POLYPECTOMY:  - Partially denuded dysplastic squamous mucosa, see comment.  B. SUPRAGLOTTIC MASS, RIGHT, BIOPSY:  - Squamous cell carcinoma  Nutrition Status Yes No Comments  Weight changes? [x]  []  Patient reports during the last 6 months she has noticed weight loss without trying  Swallowing concerns? []  [x]    PEG? []  [x]     Referrals Yes No Comments  Social Work? [x]  []    Dentistry? [x]  []    Swallowing therapy? [x]  []    Nutrition? [x]  []    Med/Onc? [x]  []  Dr. Simone Curia 02/17/2020; no further F/U schceduled   Safety Issues Yes No Comments  Prior radiation? []  [x]    Pacemaker/ICD? []  [x]    Possible current pregnancy? []  [x]  Post menopausal  Is the patient on methotrexate? []  [x]     Tobacco/Marijuana/Snuff/ETOH use: Current smoker (60 year history; has not smoked 2ppd in at least one year; now about 5-6 cigarettes per day). Denies alcohol consumption, and does not use illicit drugs  Past/Anticipated interventions by otolaryngology, if any:  01/28/2020 Dr. Izora Gala DIRECT LARYNGOSCOPY WITH BIOPSY RIGID ESOPHAGOSCOPY  Past/Anticipated interventions by medical oncology, if any:  Consult with Dr. Betsy Coder  02/17/2020 --She appears to  have an early stage tumor amenable to single modality therapy (definitive radiation or surgery) --There is no role for systemic therapy in her case --We discussed the potential need for a feeding tube --She will need endoscopic follow-up with Dr. Constance Holster to evaluate the larynx for remaining tumor and vocal cord nodules --We will request HPV testing on the supraglottic mass biopsy --Ms. Luth is not scheduled for a follow-up visit in medical oncology.  I will be available to see her as needed.   Current Complaints / other details:  Nothing of note

## 2020-02-20 ENCOUNTER — Other Ambulatory Visit: Payer: Self-pay

## 2020-02-20 ENCOUNTER — Ambulatory Visit
Admission: RE | Admit: 2020-02-20 | Discharge: 2020-02-20 | Disposition: A | Discharge status: Home / Self Care | Payer: Medicare HMO | Source: Ambulatory Visit | Attending: Radiation Oncology | Admitting: Radiation Oncology

## 2020-02-20 ENCOUNTER — Inpatient Hospital Stay: Payer: Medicare HMO

## 2020-02-20 ENCOUNTER — Encounter: Payer: Self-pay | Admitting: Radiation Oncology

## 2020-02-20 VITALS — BP 116/65 | HR 74 | Temp 98.1°F | Resp 20 | Ht 60.0 in | Wt 197.4 lb

## 2020-02-20 DIAGNOSIS — C321 Malignant neoplasm of supraglottis: Secondary | ICD-10-CM | POA: Diagnosis present

## 2020-02-20 DIAGNOSIS — Z23 Encounter for immunization: Secondary | ICD-10-CM

## 2020-02-20 DIAGNOSIS — Z923 Personal history of irradiation: Secondary | ICD-10-CM | POA: Diagnosis not present

## 2020-02-20 MED ORDER — NICOTINE 14 MG/24HR TD PT24
MEDICATED_PATCH | TRANSDERMAL | 2 refills | Status: AC
Start: 2020-02-20 — End: ?

## 2020-02-20 MED ORDER — NICOTINE 7 MG/24HR TD PT24: MEDICATED_PATCH | TRANSDERMAL | 0 refills | Status: AC

## 2020-02-20 MED ORDER — BUPROPION HCL ER (SR) 150 MG PO TB12: ORAL_TABLET | ORAL | 2 refills | Status: AC

## 2020-02-20 MED FILL — NICOTINE 14 MG/24HR PATCH: 14 | 14 days supply | Qty: 14 | Fill #0

## 2020-02-20 MED FILL — BUPROPION HCL SR 150 MG TAB: 150 | 30 days supply | Qty: 60 | Fill #0

## 2020-02-20 NOTE — Progress Notes (Signed)
   Covid-19 Vaccination Clinic  Name:  Sara Farmer    MRN: [id number] DOB: 13-Jul-1950  02/20/2020  Sara Farmer was observed post Covid-19 immunization for 15 minutes without incident. She was provided with Vaccine Information Sheet and instruction to access the V-Safe system.   Sara Farmer was instructed to call 911 with any severe reactions post vaccine: Marland Kitchen Difficulty breathing  . Swelling of face and throat  . A fast heartbeat  . A bad rash all over body  . Dizziness and weakness    1

## 2020-02-20 NOTE — Progress Notes (Signed)
Radiation Oncology         [phone] ________________________________  Initial outpatient Consultation  Name: Sara Farmer MRN: [id number]  Date: 02/20/2020  DOB: 1950-07-06  CC:Sara Noon, MD  Sara Gala, MD   REFERRING PHYSICIAN: Izora Gala, MD  DIAGNOSIS:    ICD-10-CM   1. Squamous cell carcinoma of supraglottis (HCC)  C32.1 buPROPion (WELLBUTRIN SR) 150 MG 12 hr tablet    nicotine (NICODERM CQ - DOSED IN MG/24 HR) 7 mg/24hr patch    nicotine (NICODERM CQ - DOSED IN MG/24 HOURS) 14 mg/24hr patch  2. Malignant neoplasm of supraglottis (HCC)  C32.1 buPROPion (WELLBUTRIN SR) 150 MG 12 hr tablet    nicotine (NICODERM CQ - DOSED IN MG/24 HR) 7 mg/24hr patch    nicotine (NICODERM CQ - DOSED IN MG/24 HOURS) 14 mg/24hr patch   Cancer Staging Malignant neoplasm of supraglottis (HCC) Staging form: Larynx - Supraglottis, AJCC 8th Edition - Clinical stage from 02/20/2020: Stage II (cT2, cN0, cM0) - Signed by Sara Gibson, MD on 02/20/2020  CHIEF COMPLAINT: Here to discuss management of throat cancer  HISTORY OF PRESENT ILLNESS::Sara Farmer is a 69 y.o. female who presented with a sore throat beginning in April of this year.  Her symptoms did not improve with antifungal therapy.  She was evaluated by an ENT physician and reports no improvement after another medication was prescribed. She was prescribed doxycycline for sinus symptoms.  She developed hoarseness a few weeks prior to being referred to [facility] on 01/22/2020.  A flexible laryngoscopy revealed concern for a mass in the right supraglottic region with an irregular posterior mucosal surface of the epiglottis.  Bilateral vocal cord polyps noted.  Biopsies revealed:  01/28/2020 FINAL MICROSCOPIC DIAGNOSIS:  A. VOCAL CORD, LEFT, POLYPECTOMY:  - Partially denuded dysplastic squamous mucosa, see comment. (The dysplasia is favored to be moderate to severe. Dr. Gari Farmer has reviewed the case. ) B. SUPRAGLOTTIC  MASS, RIGHT, BIOPSY:  - Squamous cell carcinoma   Nutrition Status Yes No Comments  Weight changes? [x]  []  Patient reports during the last 6 months she has noticed weight loss without trying  Swallowing concerns? []  [x]    PEG? []  [x]     Referrals Yes No Comments  Social Work? [x]  []    Dentistry? [x]  []    Swallowing therapy? [x]  []    Nutrition? [x]  []    Med/Onc? [x]  []  Sara Farmer 02/17/2020; no further F/U schceduled   Safety Issues Yes No Comments  Prior radiation? []  [x]    Pacemaker/ICD? []  [x]    Possible current pregnancy? []  [x]  Post menopausal  Is the patient on methotrexate? []  [x]       Tobacco/Marijuana/Snuff/ETOH use: Current smoker (60 year history; has not smoked 2ppd in at least one year; now about 5-6 cigarettes per day). Denies alcohol consumption, and does not use illicit drugs  Past/Anticipated interventions by otolaryngology, if any:  01/28/2020 Dr. Izora Farmer DIRECT LARYNGOSCOPY WITH BIOPSY RIGID ESOPHAGOSCOPY  Past/Anticipated interventions by medical oncology, if any:  Consult with Sara Farmer  02/17/2020 --She appears to have an early stage tumor amenable to single modality therapy (definitive radiation or surgery) --There is no role for systemic therapy in her case --We discussed the potential need for a feeding tube --She will need endoscopic follow-up with Dr. Constance Farmer to evaluate the larynx for remaining tumor and vocal cord nodules --We will request HPV testing on the supraglottic mass biopsy --Sara Farmer is not scheduled for a follow-up visit in medical oncology.  I will be available to see her as needed.   Current Complaints / other details:  Nothing of note  PREVIOUS RADIATION THERAPY: No  PAST MEDICAL HISTORY:  has a past medical history of Anxiety, Depression, Diabetes mellitus without complication ([place]), HLD (hyperlipidemia), and Hypertension.    PAST SURGICAL HISTORY: Past Surgical History:  Procedure Laterality Date  .  CARPAL TUNNEL RELEASE Bilateral   . CESAREAN SECTION     x 2  . DIRECT LARYNGOSCOPY N/A 01/28/2020   Procedure: DIRECT LARYNGOSCOPY WITH BIOPSY;  Surgeon: Sara Gala, MD;  Location: [place];  Service: ENT;  Laterality: N/A;  . EYE SURGERY Bilateral    Lasik  . RIGID ESOPHAGOSCOPY N/A 01/28/2020   Procedure: RIGID ESOPHAGOSCOPY;  Surgeon: Sara Gala, MD;  Location: [place];  Service: ENT;  Laterality: N/A;  . TUBAL LIGATION      FAMILY HISTORY: family history is not on file.  SOCIAL HISTORY:  reports that she has been smoking cigarettes. She has a 58.00 pack-year smoking history. She has never used smokeless tobacco. She reports that she does not drink alcohol and does not use drugs.  ALLERGIES: Penicillins and Sulfa antibiotics  MEDICATIONS:  Current Outpatient Medications  Medication Sig Dispense Refill  . albuterol (VENTOLIN HFA) 108 (90 Base) MCG/ACT inhaler Inhale 2 puffs into the lungs as needed.    Marland Kitchen amLODipine (NORVASC) 10 MG tablet Take 10 mg by mouth daily.    . Ibuprofen-diphenhydrAMINE HCl (ADVIL PM) 200-25 MG CAPS Take 1 tablet by mouth at bedtime as needed (Sleep).    Marland Kitchen PARoxetine (PAXIL) 40 MG tablet Take 40 mg by mouth daily.    . simvastatin (ZOCOR) 40 MG tablet Take 40 mg by mouth at bedtime.    . valsartan-hydrochlorothiazide (DIOVAN-HCT) 320-25 MG tablet Take 1 tablet by mouth daily.    Marland Kitchen buPROPion (WELLBUTRIN SR) 150 MG 12 hr tablet Start one week before quit date. Take 1 tab daily x 3 days, then 1 tab BID thereafter. 60 tablet 2  . nicotine (NICODERM CQ - DOSED IN MG/24 HOURS) 14 mg/24hr patch Apply 1 patch to skin daily for 6 weeks. 14 patch 2  . nicotine (NICODERM CQ - DOSED IN MG/24 HR) 7 mg/24hr patch Apply 1 patch to skin daily for 2 weeks. Finish Rx for 14mg  patch, first. 14 patch 0   No current facility-administered medications for this encounter.    REVIEW OF SYSTEMS:  Notable for that above.   PHYSICAL EXAM:  height is 5' (1.524 m) and weight is 197 lb  6.4 oz (89.5 kg). Her temperature is 98.1 F (36.7 C). Her blood pressure is 116/65 and her pulse is 74. Her respiration is 20 and oxygen saturation is 98%.   General: Alert and oriented, in no acute distress. hoarse HEENT: Head is normocephalic. Extraocular movements are intact. Oropharynx is notable for no upper throat lesions. Neck: Neck is notable for  No palpable masses in cervical or SCV regions. Skin: No concerning lesions over neck Musculoskeletal: symmetric strength and muscle tone throughout. Ambulatory.  Neurologic: Cranial nerves II through XII are grossly intact. No obvious focalities. Speech is fluent. Coordination is intact. Psychiatric: Judgment and insight are intact. Affect is appropriate.   ECOG = 1  0 - Asymptomatic (Fully active, able to carry on all predisease activities without restriction)  1 - Symptomatic but completely ambulatory (Restricted in physically strenuous activity but ambulatory and able to carry out work of a light or sedentary nature. For example, light housework,  office work)  2 - Symptomatic, <50% in bed during the day (Ambulatory and capable of all self care but unable to carry out any work activities. Up and about more than 50% of waking hours)  3 - Symptomatic, >50% in bed, but not bedbound (Capable of only limited self-care, confined to bed or chair 50% or more of waking hours)  4 - Bedbound (Completely disabled. Cannot carry on any self-care. Totally confined to bed or chair)  5 - Death   Eustace Pen MM, Creech RH, Tormey DC, et al. [phone]). "Toxicity and response criteria of the [facility]". [place] Oncol. 5 (6): 649-55   LABORATORY DATA:  Lab Results  Component Value Date   WBC 14.2 (H) 01/28/2020   HGB 15.8 (H) 01/28/2020   HCT 47.5 (H) 01/28/2020   MCV 87.0 01/28/2020   PLT 362 01/28/2020   CMP     Component Value Date/Time   NA 137 01/28/2020 0744   K 2.6 (LL) 01/28/2020 0744   CL 96 (L) 01/28/2020 0744    CO2 24 01/28/2020 0744   GLUCOSE 159 (H) 01/28/2020 0744   BUN 25 (H) 01/28/2020 0744   CREATININE 1.14 (H) 01/28/2020 0744   CALCIUM 9.5 01/28/2020 0744   GFRNONAA 49 (L) 01/28/2020 0744   GFRAA 56 (L) 01/28/2020 0744      No results found for: TSH   RADIOGRAPHY: NM PET Image Initial (PI) Skull Base To Thigh  Result Date: 02/16/2020 CLINICAL DATA:  Initial treatment strategy for laryngeal cancer. EXAM: NUCLEAR MEDICINE PET SKULL BASE TO THIGH TECHNIQUE: 9.9 mCi F-18 FDG was injected intravenously. Full-ring PET imaging was performed from the skull base to thigh after the radiotracer. CT data was obtained and used for attenuation correction and anatomic localization. Fasting blood glucose: 145 mg/dl COMPARISON:  01/22/2020 neck CT FINDINGS: Mediastinal blood pool activity: SUV max 4.2 Liver activity: SUV max NA NECK: There is a hypermetabolic 2.3 cm supraglottic laryngeal mass centered to the right of midline with max SUV 12.6 (series 4/image 37). No enlarged or hypermetabolic lymph nodes in the neck. Incidental CT findings: Non hypermetabolic hypodense bilateral thyroid nodules, largest 1.5 cm on the left. Mucoperiosteal thickening in the bilateral maxillary sinuses without fluid levels. CHEST: No enlarged or hypermetabolic axillary, mediastinal or hilar lymph nodes. No hypermetabolic pulmonary findings. Incidental CT findings: Atherosclerotic nonaneurysmal thoracic aorta. Mild paraseptal emphysema. No acute consolidative airspace disease, lung masses or significant pulmonary nodules. ABDOMEN/PELVIS: No abnormal hypermetabolic activity within the liver, pancreas, adrenal glands, or spleen. No hypermetabolic lymph nodes in the abdomen or pelvis. Incidental CT findings: Cholelithiasis. Diffuse hepatic steatosis. Multiple simple right renal cysts, largest 6.9 cm superiorly. Atherosclerotic nonaneurysmal abdominal aorta. Mildly enlarged myomatous uterus with coarsely calcified right fundal uterine  fibroid. SKELETON: No focal hypermetabolic activity to suggest skeletal metastasis. Incidental CT findings: none IMPRESSION: 1. Hypermetabolic 2.3 cm right supraglottic laryngeal mass compatible with known primary malignancy. 2. No hypermetabolic neck lymph nodes or distant metastatic disease. 3. Chronic findings include: Aortic Atherosclerosis (ICD10-I70.0) and Emphysema (ICD10-J43.9). Cholelithiasis. Diffuse hepatic steatosis. Electronically Signed   By: Ilona Sorrel M.D.   On: 02/16/2020 10:35      IMPRESSION/PLAN:  This is a delightful patient with head and neck cancer. I recommend radiotherapy (7 week course) for this patient.  We discussed the potential risks, benefits, and side effects of radiotherapy. We talked in detail about acute and late effects. We discussed that some of the most bothersome acute effects may be mucositis,  dysgeusia, salivary changes, skin irritation, hair loss, dehydration, weight loss and fatigue. We talked about late effects which include but are not necessarily limited to dysphagia, hypothyroidism, nerve injury, spinal cord injury, xerostomia, trismus, and neck edema. No guarantees of treatment were given. A consent form was signed and placed in the patient's medical record. The patient is enthusiastic about proceeding with treatment. I look forward to participating in the patient's care.    Simulation (treatment planning) will take place in a week  We also discussed that the treatment of head and neck cancer is a multidisciplinary process to maximize treatment outcomes and quality of life. For this reason the following referrals have been or will be made:   Medical oncology to discuss chemotherapy  (they do not recommend chemotherapy per national guidelines).   Dentistry for dental evaluation, advice on reducing risk of cavities, osteoradionecrosis, or other oral issues.    Nutritionist for nutrition support during and after treatment.   Speech language pathology  for swallowing and/or speech therapy.   Social work for social support.    Physical therapy due to risk of lymphedema in neck and deconditioning.   Baseline labs including TSH.  The patient had incidental nodule (50mm)  noted on her thyroid gland on CT imaging.  This is something that otolaryngology typically addresses after the patients complete treatment for their known cancer.  I will defer to Dr. Constance Farmer on this.  We discussed measures to reduce the risk of infection during the COVID-19 pandemic.  She will receive a vaccine today upstairs.  I asked the patient today about tobacco use. The patient uses tobacco.  I advised the patient to quit. Services were offered by me today including outpatient counseling and pharmacotherapy. I assessed for the willingness to attempt to quit and provided encouragement and demonstrated willingness to make referrals and/or prescriptions to help the patient attempt to quit. The patient has follow-up with the oncologic team to touch base on their tobacco use and /or cessation efforts.  Over 5 minutes were spent on this issue.  She plans to quit by the last day of the month, September 30.  She  accepted prescriptions for nicotine patches and bupropion.  Hypokalemia: She had significant hypokalemia noted on her lab work before her consultation with medical oncology.  I will reach out to medical oncology to see if this has been addressed, and if not, nursing will notify her PCP.  On date of service, in total, I spent 60 minutes on this encounter. Patient was seen in person.  __________________________________________   Sara Gibson, MD

## 2020-02-20 NOTE — Progress Notes (Signed)
Oncology Nurse Navigator Documentation  Met with patient during initial consult with Dr. Isidore Moos. She was accompanied by her husband, Ronalee Belts. . Further introduced myself as his/their Navigator, explained my role as a member of the Care Team. . Assisted with post-consult appt scheduling. . I provided them a tour of the ground floor, including CT simulation area and treatment machines. I then explained the process of the radiation waiting room and the use of the computers in the room to know when to proceed to the treatment machines for radiation.  . They verbalized understanding of information provided. . I encouraged them to call with questions/concerns moving forward.  Harlow Asa, RN, BSN, OCN Head & Neck Oncology Nurse [place] [facility] [place] [phone]

## 2020-02-20 NOTE — Progress Notes (Signed)
   Covid-19 Vaccination Clinic  Name:  Sara Farmer    MRN: [id number] DOB: 08/08/1950  02/20/2020  Sara Farmer was observed post Covid-19 immunization for 15 minutes without incident. She was provided with Vaccine Information Sheet and instruction to access the V-Safe system.   Sara Farmer was instructed to call 911 with any severe reactions post vaccine: Marland Kitchen Difficulty breathing  . Swelling of face and throat  . A fast heartbeat  . A bad rash all over body  . Dizziness and weakness

## 2020-02-23 ENCOUNTER — Telehealth: Payer: Self-pay | Admitting: Radiation Oncology

## 2020-02-23 ENCOUNTER — Other Ambulatory Visit: Payer: Self-pay

## 2020-02-23 DIAGNOSIS — C321 Malignant neoplasm of supraglottis: Secondary | ICD-10-CM

## 2020-02-23 NOTE — Progress Notes (Signed)
Dental Form with Estimates of Radiation Dose      Diagnosis:    ICD-10-CM   1. Squamous cell carcinoma of supraglottis (HCC)  C32.1 buPROPion (WELLBUTRIN SR) 150 MG 12 hr tablet    nicotine (NICODERM CQ - DOSED IN MG/24 HR) 7 mg/24hr patch    nicotine (NICODERM CQ - DOSED IN MG/24 HOURS) 14 mg/24hr patch  2. Malignant neoplasm of supraglottis (HCC)  C32.1 buPROPion (WELLBUTRIN SR) 150 MG 12 hr tablet    nicotine (NICODERM CQ - DOSED IN MG/24 HR) 7 mg/24hr patch    nicotine (NICODERM CQ - DOSED IN MG/24 HOURS) 14 mg/24hr patch    Prognosis: curative  Anticipated # of fractions: 35    Daily?: yes  # of weeks of radiotherapy: 7  Chemotherapy?: no  Anticipated xerostomia:  Mild permanent    Pre-simulation needs:  anticipate sim this week.  Please address basic dental hygiene management; advise on dry mouth for future.  No extractions, please. SPDs can be considered if made before sim, but sim ASAP is the top priority. I requested sim for Sept 24.  Please contact Eppie Gibson, MD, with patient's disposition after evaluation and/or dental treatment.

## 2020-02-23 NOTE — Telephone Encounter (Signed)
Scheduled appointment per 9/20 scheduling message. Spoke with patient who is aware of appointment date and time.

## 2020-02-25 ENCOUNTER — Other Ambulatory Visit: Payer: Self-pay

## 2020-02-25 ENCOUNTER — Encounter (HOSPITAL_COMMUNITY): Payer: Self-pay | Admitting: Dentistry

## 2020-02-25 ENCOUNTER — Ambulatory Visit (HOSPITAL_COMMUNITY): Payer: Self-pay | Admitting: Dentistry

## 2020-02-25 VITALS — BP 142/88 | HR 77 | Temp 98.6°F

## 2020-02-25 DIAGNOSIS — Z01818 Encounter for other preprocedural examination: Secondary | ICD-10-CM

## 2020-02-25 DIAGNOSIS — M270 Developmental disorders of jaws: Secondary | ICD-10-CM

## 2020-02-25 DIAGNOSIS — M2632 Excessive spacing of fully erupted teeth: Secondary | ICD-10-CM

## 2020-02-25 DIAGNOSIS — K0530 Chronic periodontitis, unspecified: Secondary | ICD-10-CM

## 2020-02-25 DIAGNOSIS — K06010 Localized gingival recession, unspecified: Secondary | ICD-10-CM

## 2020-02-25 DIAGNOSIS — K08409 Partial loss of teeth, unspecified cause, unspecified class: Secondary | ICD-10-CM

## 2020-02-25 DIAGNOSIS — Z972 Presence of dental prosthetic device (complete) (partial): Secondary | ICD-10-CM

## 2020-02-25 DIAGNOSIS — K036 Deposits [accretions] on teeth: Secondary | ICD-10-CM

## 2020-02-25 DIAGNOSIS — C321 Malignant neoplasm of supraglottis: Secondary | ICD-10-CM

## 2020-02-25 DIAGNOSIS — K0889 Other specified disorders of teeth and supporting structures: Secondary | ICD-10-CM

## 2020-02-25 DIAGNOSIS — M264 Malocclusion, unspecified: Secondary | ICD-10-CM

## 2020-02-25 DIAGNOSIS — K117 Disturbances of salivary secretion: Secondary | ICD-10-CM

## 2020-02-25 DIAGNOSIS — M278 Other specified diseases of jaws: Secondary | ICD-10-CM

## 2020-02-25 NOTE — Progress Notes (Signed)
DENTAL CONSULTATION  Date of Consultation:  02/25/2020 Patient Name:   Sara Farmer Date of Birth:   July 24, 1950 Medical Record Number: [id number]  COVID 19 SCREENING: The patient does not symptoms concerning for COVID-19 infection (Including fever, chills, cough, or new SHORTNESS OF BREATH).    VITALS: BP (!) 142/88 (BP Location: Right Arm)   Pulse 77   Temp 98.6 F (37 C)   LMP  (LMP Unknown)   CHIEF COMPLAINT: Patient referred by Dr. Isidore Moos for dental consultation.  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  Patient with anticipated radiation therapy.  Patient is now seen as part of a preradiation therapy dental protocol examination.  The patient currently denies acute toothaches, swellings, or abscesses.  Patient was last seen by dentist approximately 6 to 7 years ago.  Patient had a dental cleaning at that time.  Patient has been unable to see the dentist due to economic concerns.  Patient has an upper complete denture and a lower cast partial denture.  The upper denture was fabricated 10 years ago at [facility].  The lower partial denture was fabricated 15 years ago by a primary dentist.  Patient indicates that the dentures "fit good" with denture adhesive.  The patient denies having dental phobia.  PROBLEM LIST: Patient Active Problem List   Diagnosis Date Noted  . Malignant neoplasm of supraglottis ([place]) 02/20/2020    Priority: High    PMH: Past Medical History:  Diagnosis Date  . Anxiety   . Depression   . Diabetes mellitus without complication ([place])    Type 2 - Patient denies this DX - No meds  . HLD (hyperlipidemia)   . Hypertension     PSH: Past Surgical History:  Procedure Laterality Date  . CARPAL TUNNEL RELEASE Bilateral   . CESAREAN SECTION     x 2  . DIRECT LARYNGOSCOPY N/A 01/28/2020   Procedure: DIRECT LARYNGOSCOPY WITH BIOPSY;  Surgeon: Izora Gala, MD;  Location:  [place];  Service: ENT;  Laterality: N/A;  . EYE SURGERY Bilateral    Lasik  . RIGID ESOPHAGOSCOPY N/A 01/28/2020   Procedure: RIGID ESOPHAGOSCOPY;  Surgeon: Izora Gala, MD;  Location: [place];  Service: ENT;  Laterality: N/A;  . TUBAL LIGATION      ALLERGIES: Allergies  Allergen Reactions  . Penicillins Rash    Trouble breathing  Childhood   . Sulfa Antibiotics Rash    Trouble breathing Childhood    MEDICATIONS: Current Outpatient Medications  Medication Sig Dispense Refill  . amLODipine (NORVASC) 10 MG tablet Take 10 mg by mouth daily.    Marland Kitchen buPROPion (WELLBUTRIN SR) 150 MG 12 hr tablet Start one week before quit date. Take 1 tab daily x 3 days, then 1 tab BID thereafter. 60 tablet 2  . Ibuprofen-diphenhydrAMINE HCl (ADVIL PM) 200-25 MG CAPS Take 1 tablet by mouth at bedtime as needed (Sleep).    Marland Kitchen PARoxetine (PAXIL) 40 MG tablet Take 40 mg by mouth daily.    . simvastatin (ZOCOR) 40 MG tablet Take 40 mg by mouth at bedtime.    . valsartan-hydrochlorothiazide (DIOVAN-HCT) 320-25 MG tablet Take 1 tablet by mouth daily.    Marland Kitchen albuterol (VENTOLIN HFA) 108 (90 Base) MCG/ACT inhaler Inhale 2 puffs into the lungs as needed. (Patient not taking: Reported on 02/25/2020)    . nicotine (NICODERM CQ - DOSED IN MG/24 HOURS) 14 mg/24hr patch Apply 1 patch to skin daily for 6  weeks. (Patient not taking: Reported on 02/25/2020) 14 patch 2  . nicotine (NICODERM CQ - DOSED IN MG/24 HR) 7 mg/24hr patch Apply 1 patch to skin daily for 2 weeks. Finish Rx for 14mg  patch, first. (Patient not taking: Reported on 02/25/2020) 14 patch 0   No current facility-administered medications for this visit.    LABS: Lab Results  Component Value Date   WBC 14.2 (H) 01/28/2020   HGB 15.8 (H) 01/28/2020   HCT 47.5 (H) 01/28/2020   MCV 87.0 01/28/2020   PLT 362 01/28/2020      Component Value Date/Time   NA 137 01/28/2020 0744   K 2.6 (LL) 01/28/2020 0744   CL 96 (L) 01/28/2020 0744   CO2 24 01/28/2020 0744    GLUCOSE 159 (H) 01/28/2020 0744   BUN 25 (H) 01/28/2020 0744   CREATININE 1.14 (H) 01/28/2020 0744   CALCIUM 9.5 01/28/2020 0744   GFRNONAA 49 (L) 01/28/2020 0744   GFRAA 56 (L) 01/28/2020 0744   No results found for: INR, PROTIME No results found for: PTT  SOCIAL HISTORY: Social History   Socioeconomic History  . Marital status: Married    Spouse name: Not on file  . Number of children: 2  . Years of education: Not on file  . Highest education level: Not on file  Occupational History  . Not on file  Tobacco Use  . Smoking status: Current Every Day Smoker    Packs/day: 1.00    Years: 58.00    Pack years: 58.00    Types: Cigarettes  . Smokeless tobacco: Never Used  . Tobacco comment: patient is cutting back; now down to 5-6 cig. a day  Vaping Use  . Vaping Use: Never used  Substance and Sexual Activity  . Alcohol use: Never  . Drug use: Never  . Sexual activity: Not Currently    Birth control/protection: Post-menopausal  Other Topics Concern  . Not on file  Social History Narrative  . Not on file   Social Determinants of Health   Financial Resource Strain:   . Difficulty of Paying Living Expenses: Not on file  Food Insecurity: No Food Insecurity  . Worried About Charity fundraiser in the Last Year: Never true  . Ran Out of Food in the Last Year: Never true  Transportation Needs: No Transportation Needs  . Lack of Transportation (Medical): No  . Lack of Transportation (Non-Medical): No  Physical Activity:   . Days of Exercise per Week: Not on file  . Minutes of Exercise per Session: Not on file  Stress:   . Feeling of Stress : Not on file  Social Connections:   . Frequency of Communication with Friends and Family: Not on file  . Frequency of Social Gatherings with Friends and Family: Not on file  . Attends Religious Services: Not on file  . Active Member of Clubs or Organizations: Not on file  . Attends Archivist Meetings: Not on file  . Marital  Status: Not on file  Intimate Partner Violence:   . Fear of Current or Ex-Partner: Not on file  . Emotionally Abused: Not on file  . Physically Abused: Not on file  . Sexually Abused: Not on file    FAMILY HISTORY: Family History  Problem Relation Age of Onset  . Stroke Mother     REVIEW OF SYSTEMS: Reviewed with the patient as per History of present illness. Psych: Patient denies having dental phobia.  DENTAL HISTORY: CHIEF COMPLAINT: Patient  referred by Dr. Isidore Moos for dental consultation.  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  Patient with anticipated radiation therapy.  Patient is now seen as part of a preradiation therapy dental protocol examination.  The patient currently denies acute toothaches, swellings, or abscesses.  Patient was last seen by dentist approximately 6 to 7 years ago.  Patient had a dental cleaning at that time.  Patient has been unable to see the dentist due to economic concerns.  Patient has an upper complete denture and a lower cast partial denture.  The upper denture was fabricated 10 years ago at [facility].  The lower partial denture was fabricated 15 years ago by a primary dentist.  Patient indicates that the dentures "fit good" with denture adhesive.  The patient denies having dental phobia.   DENTAL EXAMINATION: GENERAL: The patient is a well-developed, well-nourished female in no acute distress. HEAD AND NECK: There is no palpable neck lymphadenopathy.  The patient denies acute TMJ symptoms. INTRAORAL EXAM: Patient has xerostomia.  Patient has a mid palatal torus.  Patient has buccal exostoses in the area of tooth numbers 1 through 2 and 15 through 16.  The upper complete denture was made around these buccal exostoses, however.  There is atrophy of the edentulous alveolar ridges.  There is no evidence of oral abscess formation. DENTITION: Patient is missing all  teeth with the exception of tooth numbers 21 through 28.  Multiple diastemas are noted.  There are multiple rotated teeth. PERIODONTAL: The patient has chronic periodontitis with plaque and calculus accumulations, generalized gingival recession, and moderate bone loss.  Radiographic calculus is noted.  Tooth mobility is noted as per dental charting form. DENTAL CARIES/SUBOPTIMAL RESTORATIONS: There are no obvious dental caries noted. ENDODONTIC: Patient currently denies acute pulpitis symptoms.  I do not see any evidence of periapical pathology. CROWN AND BRIDGE: No crown or bridge restorations are noted.   PROSTHODONTIC: Patient has an upper complete denture and lower cast partial denture.  Both of these are less than ideal with regard to retention and stability.  Patient is able to use the dentures without problems using denture adhesive. OCCLUSION: The patient has a poor occlusal scheme and malocclusion.  RADIOGRAPHIC INTERPRETATION: Orthopantogram was taken and supplemented with 4 lower periapical radiographs. There are multiple missing teeth.  There is moderate to severe bone loss.  Radiographic calculus is noted.  Multiple diastemas are noted.  There is atrophy of the edentulous alveolar ridges.  The maxillary right sinus is opacified with questionable mucous retention cyst.   ASSESSMENTS: 1.  Squamous cell carcinoma of the supraglottis 2.  Preradiation therapy dental protocol 3.  Chronic periodontitis with bone loss 4.  Gingival recession 5.  Accretions 6.  Tooth mobility 7.  Mid palatal torus 8.  Bilateral maxillary buccal exostoses in the area of tooth numbers 1 through 2 and 15 through 16. 9.  Multiple missing teeth 10.  Multiple diastemas 11.  Multiple rotated teeth 12.  Maxillary complete denture and lower cast partial dentures. 13.  Ill fitting dentures 14.  Malocclusion 15.  Xerostomia 16.  Maxillary right opacified sinus per radiographic  evaluation   PLAN/RECOMMENDATIONS: 1. I discussed the risks, benefits, and complications of various treatment options with the patient in relationship to her medical and dental conditions, anticipated radiation therapy and radiation therapy side effects to include xerostomia, radiation caries, trismus, mucositis, taste changes, gum and jawbone changes, and risk for infection and  osteoradionecrosis.. We discussed various treatment options to include no treatment, multiple extractions with alveoloplasty, pre-prosthetic surgery as indicated, periodontal therapy, dental restorations, root canal therapy, crown and bridge therapy, implant therapy, and replacement of missing teeth as indicated. The patient currently wishes to defer any dental treatment at this time.  The patient will then proceed with radiation therapy with Dr. Isidore Moos at indicated.  Patient is currently cleared for radiation therapy.  Patient will need to follow-up with a new primary dentist for comprehensive dental care, periodontal therapy, and evaluation for replacement of missing teeth as indicated 3 months after her radiation therapy has been complete.  A prescription for fluoride therapy will be sent to the [facility] pharmacy in [place], [place] with refills for 1 year.  The patient is to contact dental medicine to schedule an oral examination after radiation therapy approximately 1 month after her last radiation therapy has been given.  This will be scheduled with Dr. Sandi Mariscal as I will be retiring as of March 05, 2020.  Patient also is to consider following up with Dr. Constance Holster for evaluation of the maxillary right opacified sinus noted on the orthopantogram.   2. Discussion of findings with medical team and coordination of future medical and dental care as needed.  I spent in excess of  120 minutes during the conduct of this consultation and >50% of this time involved direct face-to-face encounter for counseling and/or  coordination of the patient's care.    Lenn Cal, DDS

## 2020-02-25 NOTE — Patient Instructions (Signed)

## 2020-02-26 ENCOUNTER — Inpatient Hospital Stay: Payer: Medicare HMO | Admitting: Nutrition

## 2020-02-26 ENCOUNTER — Ambulatory Visit
Admission: RE | Admit: 2020-02-26 | Discharge: 2020-02-26 | Disposition: A | Discharge status: Home / Self Care | Payer: Medicare HMO | Source: Ambulatory Visit | Attending: Radiation Oncology | Admitting: Radiation Oncology

## 2020-02-26 ENCOUNTER — Other Ambulatory Visit: Payer: Self-pay

## 2020-02-26 DIAGNOSIS — C321 Malignant neoplasm of supraglottis: Secondary | ICD-10-CM | POA: Diagnosis present

## 2020-02-26 DIAGNOSIS — I10 Essential (primary) hypertension: Secondary | ICD-10-CM | POA: Diagnosis not present

## 2020-02-26 DIAGNOSIS — F418 Other specified anxiety disorders: Secondary | ICD-10-CM | POA: Diagnosis not present

## 2020-02-26 DIAGNOSIS — Z510 Encounter for antineoplastic radiation therapy: Secondary | ICD-10-CM | POA: Diagnosis not present

## 2020-02-26 DIAGNOSIS — E119 Type 2 diabetes mellitus without complications: Secondary | ICD-10-CM | POA: Diagnosis not present

## 2020-02-26 DIAGNOSIS — Z79899 Other long term (current) drug therapy: Secondary | ICD-10-CM | POA: Insufficient documentation

## 2020-02-26 DIAGNOSIS — F17210 Nicotine dependence, cigarettes, uncomplicated: Secondary | ICD-10-CM | POA: Insufficient documentation

## 2020-02-26 LAB — BASIC METABOLIC PANEL - CANCER CENTER ONLY
Anion gap: 8 (ref 5–15)
BUN: 25 mg/dL — ABNORMAL HIGH (ref 8–23)
CO2: 36 mmol/L — ABNORMAL HIGH (ref 22–32)
Calcium: 10 mg/dL (ref 8.9–10.3)
Chloride: 95 mmol/L — ABNORMAL LOW (ref 98–111)
Creatinine: 1.08 mg/dL — ABNORMAL HIGH (ref 0.44–1.00)
GFR, Est AFR Am: >60 mL/min (ref >60)
GFR, Estimated: 52 mL/min — ABNORMAL LOW (ref >60)
Glucose, Bld: 110 mg/dL — ABNORMAL HIGH (ref 70–99)
Potassium: 3.4 mmol/L — ABNORMAL LOW (ref 3.5–5.1)
Sodium: 139 mmol/L (ref 135–145)

## 2020-02-26 NOTE — Progress Notes (Signed)
69 year old female diagnosed with supraglottis cancer being followed by Dr. Isidore Moos receiving radiation therapy.  Past medical history includes hypertension, hyperlipidemia, diabetes, and anxiety.  Patient currently is a smoker.  Medications include Wellbutrin, Paxil, Zocor.  Labs include potassium 3.4, glucose 110, BUN 25, and creatinine 1.08  Height: 5 feet 0 inches. Weight: 197.4 pounds September 17. BMI: 38.55.  Patient denies nutrition impact symptoms.  She is hoarse making it difficult for her to speak. Reports she loves to cook and generally has not had any difficulty chewing or swallowing.  She denies weight loss.  Nutrition diagnosis: Predicted suboptimal energy intake related to supraglottis cancer as evidenced by history or presence of a condition for which research shows an increased incidence of suboptimal energy intake.  Intervention: Patient educated to consume smaller more frequent meals and snacks with high-calorie, high-protein foods. Encouraged weight maintenance. Strategies reviewed to help with thick saliva and dry mouth. Provided fact sheets.  Questions were answered.  Teach back method used.  Contact information given.  Monitoring, evaluation, goals: Patient will tolerate adequate calories and protein for weight maintenance.  Next visit: Wednesday, October 13 after radiation therapy.  **Disclaimer: This note was dictated with voice recognition software. Similar sounding words can inadvertently be transcribed and this note may contain transcription errors which may not have been corrected upon publication of note.**

## 2020-02-27 ENCOUNTER — Ambulatory Visit
Admission: RE | Admit: 2020-02-27 | Discharge: 2020-02-27 | Disposition: A | Discharge status: Home / Self Care | Payer: Medicare HMO | Source: Ambulatory Visit | Attending: Radiation Oncology | Admitting: Radiation Oncology

## 2020-02-27 ENCOUNTER — Other Ambulatory Visit: Payer: Self-pay

## 2020-02-27 VITALS — BP 139/69 | HR 78 | Temp 98.3°F | Resp 20 | Ht 60.0 in | Wt 196.1 lb

## 2020-02-27 DIAGNOSIS — C321 Malignant neoplasm of supraglottis: Secondary | ICD-10-CM

## 2020-02-27 DIAGNOSIS — Z510 Encounter for antineoplastic radiation therapy: Secondary | ICD-10-CM | POA: Diagnosis not present

## 2020-02-27 LAB — TSH: TSH: 2.237 u[IU]/mL (ref 0.308–3.960)

## 2020-02-27 MED ORDER — SODIUM CHLORIDE 0.9% FLUSH
10.0000 mL | Freq: Once | INTRAVENOUS | Status: AC
  Administered 2020-02-27: 10 mL via INTRAVENOUS

## 2020-02-27 NOTE — Progress Notes (Signed)
Has armband been applied?  Yes.    Does patient have an allergy to IV contrast dye?: No.   Has patient ever received premedication for IV contrast dye?: No.   Does patient take metformin?: No.  Date of lab work: February 26, 2020 BUN: 25 CR: 1.08  IV site: forearm right, condition patent and no redness  Has IV site been added to flowsheet?  Yes.    Vitals:   02/27/20 0909  BP: 139/69  Pulse: 78  Resp: 20  Temp: 98.3 F (36.8 C)  SpO2: 98%

## 2020-02-27 NOTE — Progress Notes (Signed)
Oncology Nurse Navigator Documentation  To provide support, encouragement and care continuity, met with Ms. Ammons during her CT SIM. She was unaccompanied d/t [employer]er safety practices.   She tolerated procedure without difficulty, denied questions/concerns.   I toured her to [facility] treatment area, explained procedures for lobby registration, arrival to Radiation Waiting, arrival to tmt area and preparation for tmt.  She voiced understanding.   I encouraged her to call me prior to 9/30 [place].  Harlow Asa RN, BSN, OCN Head & Neck Oncology Nurse [place] at [facility] Phone # [phone]  Fax # [phone]

## 2020-03-02 DIAGNOSIS — Z510 Encounter for antineoplastic radiation therapy: Secondary | ICD-10-CM | POA: Diagnosis not present

## 2020-03-03 ENCOUNTER — Other Ambulatory Visit: Payer: Self-pay | Admitting: Otolaryngology

## 2020-03-03 DIAGNOSIS — E041 Nontoxic single thyroid nodule: Secondary | ICD-10-CM

## 2020-03-04 ENCOUNTER — Ambulatory Visit
Admission: RE | Admit: 2020-03-04 | Discharge: 2020-03-04 | Disposition: A | Discharge status: Home / Self Care | Payer: Medicare HMO | Source: Ambulatory Visit | Attending: Radiation Oncology | Admitting: Radiation Oncology

## 2020-03-04 ENCOUNTER — Other Ambulatory Visit: Payer: Self-pay

## 2020-03-04 DIAGNOSIS — C321 Malignant neoplasm of supraglottis: Secondary | ICD-10-CM

## 2020-03-04 DIAGNOSIS — Z510 Encounter for antineoplastic radiation therapy: Secondary | ICD-10-CM | POA: Diagnosis not present

## 2020-03-04 NOTE — Progress Notes (Signed)
Patient stopped in after her CT scan states she got a call for Dr Melford Aase to have a thyroid scan. She is unsure about the need for this is going to talk with Dr Isidore Moos on Monday during [place]. Questions and concerns addressed.

## 2020-03-05 ENCOUNTER — Ambulatory Visit
Admission: RE | Admit: 2020-03-05 | Discharge: 2020-03-05 | Disposition: A | Discharge status: Home / Self Care | Payer: Medicare HMO | Source: Ambulatory Visit | Attending: Radiation Oncology | Admitting: Radiation Oncology

## 2020-03-05 DIAGNOSIS — C321 Malignant neoplasm of supraglottis: Secondary | ICD-10-CM | POA: Diagnosis present

## 2020-03-08 ENCOUNTER — Other Ambulatory Visit: Payer: Self-pay | Admitting: Radiation Oncology

## 2020-03-08 ENCOUNTER — Other Ambulatory Visit: Payer: Medicare HMO

## 2020-03-08 ENCOUNTER — Ambulatory Visit
Admission: RE | Admit: 2020-03-08 | Discharge: 2020-03-08 | Disposition: A | Discharge status: Home / Self Care | Payer: Medicare HMO | Source: Ambulatory Visit | Attending: Radiation Oncology | Admitting: Radiation Oncology

## 2020-03-08 ENCOUNTER — Other Ambulatory Visit: Payer: Self-pay

## 2020-03-08 DIAGNOSIS — C321 Malignant neoplasm of supraglottis: Secondary | ICD-10-CM

## 2020-03-08 MED ORDER — SONAFINE EX EMUL
1.0000 "application " | Freq: Two times a day (BID) | CUTANEOUS | Status: DC
  Administered 2020-03-08: 1 via TOPICAL

## 2020-03-08 MED ORDER — LIDOCAINE VISCOUS HCL 2 % MT SOLN: OROMUCOSAL | 3 refills | Status: DC

## 2020-03-08 MED FILL — LIDOCAINE 2% VISCOUS SOLN: 2 | 10 days supply | Qty: 200 | Fill #0

## 2020-03-08 NOTE — Progress Notes (Signed)
Pt here for patient teaching.    Pt given Radiation and You booklet, Managing Acute Radiation Side Effects for Head and Neck Cancer handout, skin care instructions and Sonafine.    Reviewed areas of pertinence such as fatigue, hair loss, mouth changes, skin changes, throat changes, earaches and taste changes .   Pt able to give teach back of to pat skin, use unscented/gentle soap and drink plenty of water,apply Sonafine bid and avoid applying anything to skin within 4 hours of treatment.   Pt demonstrated understanding and verbalizes understanding of information given and will contact nursing with any questions or concerns.    Http://rtanswers.org/treatmentinformation/whattoexpect/index          

## 2020-03-09 ENCOUNTER — Ambulatory Visit
Admission: RE | Admit: 2020-03-09 | Discharge: 2020-03-09 | Disposition: A | Discharge status: Home / Self Care | Payer: Medicare HMO | Source: Ambulatory Visit | Attending: Radiation Oncology | Admitting: Radiation Oncology

## 2020-03-09 ENCOUNTER — Other Ambulatory Visit: Payer: Self-pay

## 2020-03-09 DIAGNOSIS — C321 Malignant neoplasm of supraglottis: Secondary | ICD-10-CM | POA: Diagnosis not present

## 2020-03-10 ENCOUNTER — Other Ambulatory Visit: Payer: Self-pay

## 2020-03-10 ENCOUNTER — Ambulatory Visit
Admission: RE | Admit: 2020-03-10 | Discharge: 2020-03-10 | Disposition: A | Discharge status: Home / Self Care | Payer: Medicare HMO | Source: Ambulatory Visit | Attending: Radiation Oncology | Admitting: Radiation Oncology

## 2020-03-10 DIAGNOSIS — C321 Malignant neoplasm of supraglottis: Secondary | ICD-10-CM | POA: Diagnosis not present

## 2020-03-11 ENCOUNTER — Ambulatory Visit: Payer: Medicare HMO | Attending: Radiation Oncology | Admitting: Physical Therapy

## 2020-03-11 ENCOUNTER — Encounter: Payer: Self-pay | Admitting: Physical Therapy

## 2020-03-11 ENCOUNTER — Ambulatory Visit
Admission: RE | Admit: 2020-03-11 | Discharge: 2020-03-11 | Disposition: A | Discharge status: Home / Self Care | Payer: Medicare HMO | Source: Ambulatory Visit | Attending: Radiation Oncology | Admitting: Radiation Oncology

## 2020-03-11 DIAGNOSIS — C321 Malignant neoplasm of supraglottis: Secondary | ICD-10-CM | POA: Insufficient documentation

## 2020-03-11 DIAGNOSIS — R293 Abnormal posture: Secondary | ICD-10-CM | POA: Diagnosis present

## 2020-03-11 DIAGNOSIS — R490 Dysphonia: Secondary | ICD-10-CM | POA: Insufficient documentation

## 2020-03-11 DIAGNOSIS — R1310 Dysphagia, unspecified: Secondary | ICD-10-CM | POA: Insufficient documentation

## 2020-03-11 NOTE — Therapy (Addendum)
[place], [place], [address] Phone: [phone]   Fax:  [phone]  Physical Therapy Evaluation  Patient Details  Name: Sara Farmer MRN: [id number] Date of Birth: Aug 23, 1950 Referring Provider (PT): Reita May Date: 03/11/2020   PT End of Session - 03/11/20 1140    Visit Number 1    Number of Visits 2    Date for PT Re-Evaluation 05/06/20    PT Start Time 1106    PT Stop Time 1138    PT Time Calculation (min) 32 min    Activity Tolerance Patient tolerated treatment well    Behavior During Therapy [facility] for tasks assessed/performed           Past Medical History:  Diagnosis Date  . Anxiety   . Depression   . Diabetes mellitus without complication ([place])    Type 2 - Patient denies this DX - No meds  . HLD (hyperlipidemia)   . Hypertension     Past Surgical History:  Procedure Laterality Date  . CARPAL TUNNEL RELEASE Bilateral   . CESAREAN SECTION     x 2  . DIRECT LARYNGOSCOPY N/A 01/28/2020   Procedure: DIRECT LARYNGOSCOPY WITH BIOPSY;  Surgeon: Izora Gala, MD;  Location: [place];  Service: ENT;  Laterality: N/A;  . EYE SURGERY Bilateral    Lasik  . RIGID ESOPHAGOSCOPY N/A 01/28/2020   Procedure: RIGID ESOPHAGOSCOPY;  Surgeon: Izora Gala, MD;  Location: [place];  Service: ENT;  Laterality: N/A;  . TUBAL LIGATION      There were no vitals filed for this visit.    Subjective Assessment - 03/11/20 1110    Subjective I am doing good. I am sorry I can not talk well.    Pertinent History Squamous cell carcinoma of supraglottis p16+, 01/28/20- biopsy of right supraglottis, PET on 02/16/20 revealed no metastatic disease, pt will receive radiation to larynx and bilateral neck and will complete 04/21/20, former smoker but has quit    Patient Stated Goals to gain info from provider.    Currently in Pain? No/denies    Pain Score 0-No pain              [facility] PT Assessment -  03/11/20 0001      Assessment   Medical Diagnosis SCC of supraglottis    Referring Provider (PT) S[place]    Onset Date/Surgical Date 01/28/20    Hand Dominance Right    Prior Therapy none      Precautions   Precautions Other (comment)    Precaution Comments active cancer      Restrictions   Weight Bearing Restrictions No      Balance Screen   Has the patient fallen in the past 6 months No    Has the patient had a decrease in activity level because of a fear of falling?  No    Is the patient reluctant to leave their home because of a fear of falling?  No      Home Ecologist residence    Living Arrangements Spouse/significant other    Available Help at Discharge Family    Type of [place] to enter    Entrance Stairs-Number of Steps [place] One level      Prior Function   Level of Independence Independent    Vocation Retired  Leisure pt works out in the yard frequently, gardens      Charity fundraiser Status Within Functional Limits for tasks assessed      Functional Tests   Functional tests Sit to Stand      Sit to Stand   Comments 30 sec sit to stand: 18- excellent is considered 16 at her age      Posture/Postural Control   Posture/Postural Control Postural limitations    Postural Limitations Rounded Shoulders;Forward head      ROM / Strength   AROM / PROM / Strength AROM      AROM   Overall AROM Comments shoulder ROM WFL    AROM Assessment Site Cervical    Cervical Flexion WFL    Cervical Extension WFL     Cervical - Right Side Bend WFL    Cervical - Left Side Bend WFL    Cervical - Right Rotation WFL    Cervical - Left Rotation [facility]      Ambulation/Gait   Ambulation/Gait Yes    Ambulation/Gait Assistance 7: Independent    Ambulation Distance (Feet) 10 Feet    Gait Pattern Within Functional Limits             LYMPHEDEMA/ONCOLOGY  QUESTIONNAIRE - 03/11/20 0001      Type   Cancer Type SCC of supraglottis      Lymphedema Assessments   Lymphedema Assessments Head and Neck      Head and Neck   4 cm superior to sternal notch around neck 40.5 cm    6 cm superior to sternal notch around neck 41.4 cm    8 cm superior to sternal notch around neck 42.6 cm                   Objective measurements completed on examination: See above findings.               PT Education - 03/11/20 1145    Education Details Neck ROM, importance of posture when sitting, standing and lying down, deep breathing, walking program and importance of staying active throughout treatment, CURE article on staying active, "Why exercise?" flyer, lymphedema and PT info    Person(s) Educated Patient    Methods Explanation;Handout    Comprehension Verbalized understanding               PT Long Term Goals - 03/11/20 1141      PT LONG TERM GOAL #1   Title Pt will return to baseline ROM measurements and not demonstrate any signs of lymphedema to allow pt to return to PLOF.    Time 8    Period Weeks    Status New    Target Date 05/06/20              Head and Neck Clinic Goals - 03/11/20 1141      Patient will be able to verbalize understanding of a home exercise program for cervical range of motion, posture, and walking.    Time 1    Period Days    Status Achieved      Patient will be able to verbalize understanding of proper sitting and standing posture.    Time 1    Period Days    Status Achieved      Patient will be able to verbalize understanding of lymphedema risk and availability of treatment for this condition.    Time 1    Period Days    Status  Achieved              Plan - 03/11/20 1143    Clinical Impression Statement Pt present to PT with newly diagnosed squamous cell carcinoma of supraglottis. Her shoulder and neck ROM are both WFL. Pt is currently undergoing radiation. Pt  was able to complete 18 sit to stand sin 30 seconds which is more than excellent for her age. She is currently undergoing radiation.Educated pt about signs and symptoms of lymphedema as well as anatomy and physiology of lymphatic system. Educated her in importance of staying as active as possible throughout treatment to decrease fatigue and in head and neck ROM exercises to decrease loss of ROM. Will reassess pt in 8 weeks and compare to baseline.    Stability/Clinical Decision Making Stable/Uncomplicated    Clinical Decision Making Low    Rehab Potential Excellent    PT Frequency --   eval and 1 f/u visit   PT Duration 8 weeks    PT Treatment/Interventions ADLs/Self Care Home Management;Therapeutic exercise;Patient/family education    PT Next Visit Plan reassess baselines    PT Home Exercise Plan head and neck ROM exercises    Consulted and Agree with Plan of Care Patient           Patient will benefit from skilled therapeutic intervention in order to improve the following deficits and impairments:  Postural dysfunction, Decreased knowledge of precautions  Visit Diagnosis: Abnormal posture  Malignant neoplasm of supraglottis [facility])     Problem List Patient Active Problem List   Diagnosis Date Noted  . Malignant neoplasm of supraglottis ([place]) 02/20/2020    Allyson Sabal [facility] 03/11/2020, 11:45 AM  [place], [place], [address] Phone: [phone]   Fax:  [phone]  Name: Sara Farmer MRN: [id number] Date of Birth: 29-Mar-1951  Manus Gunning, PT 03/11/20 11:48 AM

## 2020-03-11 NOTE — Progress Notes (Signed)
Oncology Nurse Navigator Documentation  I met with Ms. Bruington before her scheduled appointment with Allyson Sabal PT during head and neck MDC today. She denies any concerns related to radiation and is feeling well at this time. She knows to call me if she has any needs or concerns.  Harlow Asa RN, BSN, OCN Head & Neck Oncology Nurse [place] at [facility] Phone # [phone]  Fax # [phone]

## 2020-03-12 ENCOUNTER — Ambulatory Visit
Admission: RE | Admit: 2020-03-12 | Discharge: 2020-03-12 | Disposition: A | Discharge status: Home / Self Care | Payer: Medicare HMO | Source: Ambulatory Visit | Attending: Radiation Oncology | Admitting: Radiation Oncology

## 2020-03-12 DIAGNOSIS — C321 Malignant neoplasm of supraglottis: Secondary | ICD-10-CM | POA: Diagnosis not present

## 2020-03-15 ENCOUNTER — Ambulatory Visit
Admission: RE | Admit: 2020-03-15 | Discharge: 2020-03-15 | Disposition: A | Discharge status: Home / Self Care | Payer: Medicare HMO | Source: Ambulatory Visit | Attending: Radiation Oncology | Admitting: Radiation Oncology

## 2020-03-15 ENCOUNTER — Other Ambulatory Visit: Payer: Self-pay

## 2020-03-15 DIAGNOSIS — C321 Malignant neoplasm of supraglottis: Secondary | ICD-10-CM | POA: Diagnosis not present

## 2020-03-16 ENCOUNTER — Ambulatory Visit
Admission: RE | Admit: 2020-03-16 | Discharge: 2020-03-16 | Disposition: A | Discharge status: Home / Self Care | Payer: Medicare HMO | Source: Ambulatory Visit | Attending: Radiation Oncology | Admitting: Radiation Oncology

## 2020-03-16 DIAGNOSIS — C321 Malignant neoplasm of supraglottis: Secondary | ICD-10-CM | POA: Diagnosis not present

## 2020-03-17 ENCOUNTER — Ambulatory Visit: Payer: Medicare HMO | Admitting: Nutrition

## 2020-03-17 ENCOUNTER — Ambulatory Visit
Admission: RE | Admit: 2020-03-17 | Discharge: 2020-03-17 | Disposition: A | Discharge status: Home / Self Care | Payer: Medicare HMO | Source: Ambulatory Visit | Attending: Radiation Oncology | Admitting: Radiation Oncology

## 2020-03-17 DIAGNOSIS — C321 Malignant neoplasm of supraglottis: Secondary | ICD-10-CM | POA: Diagnosis not present

## 2020-03-17 NOTE — Progress Notes (Signed)
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  Final radiation therapy is scheduled for November 17. Noted patient weighed 200.2 pounds on October 11.  This is improved from 197.4 pounds September 17. Reports she feels as if she has a golf ball in her throat.  It is becoming very difficult to swallow. Reports she likes chocolate Slim fast but has not cared for Ensure. Reports increasing fatigue.  Nutrition diagnosis: Predicted suboptimal energy intake continues.  Intervention: Encouraged patient continue frequent small meals and snacks using easy to prepare foods. Encouraged her to prepare milkshakes using high-calorie ingredients and provided recipes. Reviewed strategies for improving fatigue. Fact sheets were provided. Questions were answered.  Teach back method used.  Monitoring, evaluation, goals: Patient will tolerate adequate calories and protein to minimize weight loss.  Next visit: Wednesday, October 20 after radiation therapy.  **Disclaimer: This note was dictated with voice recognition software. Similar sounding words can inadvertently be transcribed and this note may contain transcription errors which may not have been corrected upon publication of note.**

## 2020-03-18 ENCOUNTER — Ambulatory Visit
Admission: RE | Admit: 2020-03-18 | Discharge: 2020-03-18 | Disposition: A | Discharge status: Home / Self Care | Payer: Medicare HMO | Source: Ambulatory Visit | Attending: Radiation Oncology | Admitting: Radiation Oncology

## 2020-03-18 DIAGNOSIS — C321 Malignant neoplasm of supraglottis: Secondary | ICD-10-CM | POA: Diagnosis not present

## 2020-03-19 ENCOUNTER — Ambulatory Visit
Admission: RE | Admit: 2020-03-19 | Discharge: 2020-03-19 | Disposition: A | Discharge status: Home / Self Care | Payer: Medicare HMO | Source: Ambulatory Visit | Attending: Otolaryngology | Admitting: Otolaryngology

## 2020-03-19 ENCOUNTER — Ambulatory Visit
Admission: RE | Admit: 2020-03-19 | Discharge: 2020-03-19 | Disposition: A | Discharge status: Home / Self Care | Payer: Medicare HMO | Source: Ambulatory Visit | Attending: Radiation Oncology | Admitting: Radiation Oncology

## 2020-03-19 DIAGNOSIS — E041 Nontoxic single thyroid nodule: Secondary | ICD-10-CM

## 2020-03-19 DIAGNOSIS — C321 Malignant neoplasm of supraglottis: Secondary | ICD-10-CM | POA: Diagnosis not present

## 2020-03-22 ENCOUNTER — Ambulatory Visit
Admission: RE | Admit: 2020-03-22 | Discharge: 2020-03-22 | Disposition: A | Discharge status: Home / Self Care | Payer: Medicare HMO | Source: Ambulatory Visit | Attending: Radiation Oncology | Admitting: Radiation Oncology

## 2020-03-22 DIAGNOSIS — C321 Malignant neoplasm of supraglottis: Secondary | ICD-10-CM | POA: Diagnosis not present

## 2020-03-23 ENCOUNTER — Ambulatory Visit
Admission: RE | Admit: 2020-03-23 | Discharge: 2020-03-23 | Disposition: A | Discharge status: Home / Self Care | Payer: Medicare HMO | Source: Ambulatory Visit | Attending: Radiation Oncology | Admitting: Radiation Oncology

## 2020-03-23 DIAGNOSIS — C321 Malignant neoplasm of supraglottis: Secondary | ICD-10-CM | POA: Diagnosis not present

## 2020-03-24 ENCOUNTER — Ambulatory Visit: Payer: Medicare HMO | Admitting: Nutrition

## 2020-03-24 ENCOUNTER — Ambulatory Visit
Admission: RE | Admit: 2020-03-24 | Discharge: 2020-03-24 | Disposition: A | Discharge status: Home / Self Care | Payer: Medicare HMO | Source: Ambulatory Visit | Attending: Radiation Oncology | Admitting: Radiation Oncology

## 2020-03-24 ENCOUNTER — Other Ambulatory Visit: Payer: Self-pay

## 2020-03-24 DIAGNOSIS — C321 Malignant neoplasm of supraglottis: Secondary | ICD-10-CM | POA: Diagnosis not present

## 2020-03-24 NOTE — Progress Notes (Signed)
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 Final radiation therapy is scheduled for November 17. Weight decreased and documented as 198.2 pounds on October 18 down from 200.2 pounds October 11. Patient reports she eats whatever she wants. She is tolerating small bites of food better. She continues to drink 1 chocolate Slim fast shake daily. She noted increased fatigue.  Nutrition diagnosis: Predicted suboptimal energy intake continues.  Intervention: Educated to continue small frequent meals and snacks.  Provided a copy of a full liquid diet so patient has a meal pattern to plan her meals. Continue minimum of 1 shake daily.  Encouraged her to try recipes provided at last visit. Recommended patient try pineapple juice or papaya nectar for thick saliva. Discontinue if this is painful to swallow.  Provided samples of juice-based supplements. Enforced importance of baking soda and salt water rinses to decrease thickened saliva.  Monitoring, evaluation, goals: Patient will work to increase calories and protein to minimize weight loss.  Next visit: Thursday, October 28.  **Disclaimer: This note was dictated with voice recognition software. Similar sounding words can inadvertently be transcribed and this note may contain transcription errors which may not have been corrected upon publication of note.**

## 2020-03-25 ENCOUNTER — Other Ambulatory Visit: Payer: Self-pay

## 2020-03-25 ENCOUNTER — Ambulatory Visit
Admission: RE | Admit: 2020-03-25 | Discharge: 2020-03-25 | Disposition: A | Discharge status: Home / Self Care | Payer: Medicare HMO | Source: Ambulatory Visit | Attending: Radiation Oncology | Admitting: Radiation Oncology

## 2020-03-25 ENCOUNTER — Ambulatory Visit: Payer: Medicare HMO

## 2020-03-25 DIAGNOSIS — R293 Abnormal posture: Secondary | ICD-10-CM | POA: Diagnosis not present

## 2020-03-25 DIAGNOSIS — C321 Malignant neoplasm of supraglottis: Secondary | ICD-10-CM | POA: Diagnosis not present

## 2020-03-25 DIAGNOSIS — R1310 Dysphagia, unspecified: Secondary | ICD-10-CM

## 2020-03-25 DIAGNOSIS — R490 Dysphonia: Secondary | ICD-10-CM

## 2020-03-25 NOTE — Progress Notes (Signed)
Oncology Nurse Navigator Documentation  I met with Sara Farmer today after her radiation and before she met with Carl Schinke SLP. She is tolerating radiation well. She is having slight difficulty with po intake. I have encouraged her to try the nutritional supplements provided to her by Barb Neff RD. She knows to call me if she has any concerns or questions.   Jennifer Malmfelt RN, BSN, OCN Head & Neck Oncology Nurse Navigator [facility] at [facility] Phone # [phone]  Fax # [phone] 

## 2020-03-25 NOTE — Therapy (Signed)
[facility] [address] [place], [place], [address] Phone: [phone]   Fax:  [phone]  Speech Language Pathology Evaluation  Patient Details  Name: Sara Farmer MRN: [id number] Date of Birth: 07/25/1950 Referring Provider (SLP): Eppie Gibson, MD   Encounter Date: 03/25/2020   End of Session - 03/25/20 1048    Visit Number 1    Number of Visits 7    Date for SLP Re-Evaluation 06/23/20   90 days   SLP Start Time 0925    SLP Stop Time  1004    SLP Time Calculation (min) 39 min    Activity Tolerance Patient tolerated treatment well           Past Medical History:  Diagnosis Date  . Anxiety   . Depression   . Diabetes mellitus without complication ([place])    Type 2 - Patient denies this DX - No meds  . HLD (hyperlipidemia)   . Hypertension     Past Surgical History:  Procedure Laterality Date  . CARPAL TUNNEL RELEASE Bilateral   . CESAREAN SECTION     x 2  . DIRECT LARYNGOSCOPY N/A 01/28/2020   Procedure: DIRECT LARYNGOSCOPY WITH BIOPSY;  Surgeon: Izora Gala, MD;  Location: [place];  Service: ENT;  Laterality: N/A;  . EYE SURGERY Bilateral    Lasik  . RIGID ESOPHAGOSCOPY N/A 01/28/2020   Procedure: RIGID ESOPHAGOSCOPY;  Surgeon: Izora Gala, MD;  Location: [facility];  Service: ENT;  Laterality: N/A;  . TUBAL LIGATION      There were no vitals filed for this visit.   Subjective Assessment - 03/25/20 0936    Subjective Pt with mod hoarseness - rare intermittent aphonia. Pt eating softer, puree (dys I-II items).              SLP Evaluation [facility] - 03/25/20 4680      SLP Visit Information   SLP Received On 03/25/20    Referring Provider (SLP) Eppie Gibson, MD    Onset Date April 2021    Medical Diagnosis Supraglottic CA       Subjective   Patient/Family Stated Goal Maintain normal swallowing      General Information   HPI Presented April 2021 with sore throat and then developed  hoarseness in July-August 2021. Laryngoscopy performed and ID'd mass in supraglottic region. 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 Radiation to bil neck and larynx, which began 03-04-20.       Balance Screen   Has the patient fallen in the past 6 months No      Prior Functional Status   Cognitive/Linguistic Baseline Within functional limits      Cognition   Overall Cognitive Status Within Functional Limits for tasks assessed      Verbal Expression   Overall Verbal Expression Appears within functional limits for tasks assessed      Oral Motor/Sensory Function   Overall Oral Motor/Sensory Function Appears within functional limits for tasks assessed      Motor Speech   Overall Motor Speech Appears within functional limits for tasks assessed    Phonation Hoarse;Aphonic           Pt currently tolerates dys I-II foods and thin liquid, reports difficulty with meds. SLP suggested pt take meds with applesauce, begin with water for lubrication, or crush meds if not time release. She also reports pain with liquids approx 5/10 on 10 point pain scale. SLP  suggested pt use magic mouthwash prior to taking meds and then do HEP as well at those times. POs:  Pt stated Dr. Isidore Moos has suggested pt buy powdered thickener, as Sara Farmer stated she told Dr. Isidore Moos that she coughs when she holds water in her mouth over time and lets it trickle down her throat. SLP suggested pt no longer do this but just swallow water normally, as she ingests it into her oral cavity. She did so today x15 without any overt s/sx aspiration. Thyroid elevation appeared adequate, and swallows appeared timely, except for occasional short delay (1-2 second) with liquids due to pt anticipating pain. Pt's swallow deemed WNL at this time.   Because data states the risk for dysphagia during and after radiation treatment is high due to undergoing radiation tx, SLP taught pt about the  possibility of reduced/limited ability for PO intake during rad tx. SLP encouraged pt to continue swallowing POs as far into rad tx as possible, even ingesting POs and/or completing HEP shortly after administration of pain meds.   SLP educated pt re: changes to swallowing musculature after rad tx, and why adherence to dysphagia HEP provided today and PO consumption was necessary to inhibit muscular fibrosis following rad tx. Pt demonstrated understanding of these things to SLP.    SLP then developed a HEP for pt and pt was instructed how to perform exercises involving lingual, vocal, and pharyngeal strengthening. SLP performed each exercise and pt return demonstrated each exercise. SLP ensured pt performance was correct prior to moving on to next exercise. Pt was instructed to complete this program 2 times a day, 7 days/week until 6 months after her last rad tx, then x2 a week after that.                  SLP Education - 03/25/20 1044    Education Details HEP procedure, late effects of head/neck radiation on swallowing ability, compensations for med administration, no need for thickener powder at this time    Person(s) Educated Patient    Methods Explanation;Demonstration;Verbal cues;Handout    Comprehension Verbalized understanding;Returned demonstration;Verbal cues required;Need further instruction            SLP Short Term Goals - 03/25/20 1052      SLP SHORT TERM GOAL #1   Title pt will complete HEP with rare min A    Time 2    Period --   sessions, for all STGs   Status New      SLP SHORT TERM GOAL #2   Title pt will tell SLP why pt is completing HEP with modified independence x2 sessions    Time 2    Status New      SLP SHORT TERM GOAL #3   Title pt will tell SLP how a food journal could hasten return to a more normalized diet    Time 3    Status New            SLP Long Term Goals - 03/25/20 1054      SLP LONG TERM GOAL #1   Title pt will complete HEP  with modified independence over two visits    Time 4    Period --   sessions, for all LTGs   Status New      SLP LONG TERM GOAL #2   Title pt will describe 3 overt s/s aspiration PNA with modified independence    Time 4    Status New  SLP LONG TERM GOAL #3   Title pt will describe how to modify HEP over time, and the timeline associated with reduction in HEP frequency with modified independence over two sessions    Time [place] - 03/25/20 1049    Clinical Impression Statement Sara Farmer presents today with swallowing essentially WNL. She currently tolerates dys I-II foods and thin liquid, but reports difficulty with pharyngeal clearance with her meds. SLP suggested pt take meds with applesauce, and/or begin with water for lubrication, or start with water for lubrication and then crush meds and place in applesauce if not time release. She also reports pain with liquids today, approx 5/10 on 10 point pain scale. SLP suggested pt use magic mouthwash prior to taking meds and then do HEP as well at those times.POs:  Pt stated Dr. Isidore Moos has suggested pt buy powdered thickener, as Sara Farmer stated she told Dr. Isidore Moos that she coughs when she holds water in her mouth over time and lets it trickle down her throat. SLP suggested pt no longer do this but just swallow water normally, as she ingests it into her oral cavity. She did so today x15 without any overt s/sx aspiration. Thyroid elevation appeared adequate, and swallows appeared timely, except for occasional short delay (1-2 second) with liquids due to pt anticipating pain. Pt's swallow deemed WNL at this time.SLP designed an individualized HEP for dysphagia and pt completed each exercise on their own with total cues faded to modified independent.Data indicate that pt's swallow ability will likely decrease over the course of radiation therapy and could very well decline over time following conclusion of their radiation therapy  due to muscle disuse atrophy and/or muscle fibrosis. Pt will cont to need to be seen by SLP in order to assess safety of PO intake, assess the need for recommending any objective swallow assessment, and ensuring pt correctly completes the individualized HEP.    Speech Therapy Frequency --   once approx every 4 weeks   Duration --   7 total visits   Treatment/Interventions Aspiration precaution training;Pharyngeal strengthening exercises;Diet toleration management by SLP;Trials of upgraded texture/liquids;Compensatory techniques;Internal/external aids;Patient/family education;SLP instruction and feedback    Potential to Achieve Goals Good    SLP Home Exercise Plan provided    Consulted and Agree with Plan of Care Patient           Patient will benefit from skilled therapeutic intervention in order to improve the following deficits and impairments:   Dysphagia, unspecified type   Hoarseness of voice    Problem List Patient Active Problem List   Diagnosis Date Noted  . Malignant neoplasm of supraglottis ([place]) 02/20/2020    [facility] ,[place], [place]  03/25/2020, 10:56 AM  [facility] [address] [place], [place], [address] Phone: [phone]   Fax:  [phone]  Name: Sara Farmer MRN: [id number] Date of Birth: 1950-12-05

## 2020-03-25 NOTE — Patient Instructions (Signed)
SWALLOWING EXERCISES Do these until 6 months after your last day of radiation, then 2 times per week afterwards  1. Effortful Swallows - Press your tongue against the roof of your mouth for 3 seconds, then squeeze the muscles in your neck while you swallow your saliva or a sip of water - Repeat 10-15 times, 2-3 times a day, and use whenever you eat or drink  2. Masako Swallow - swallow with your tongue sticking out - Stick tongue out past your teeth and gently bite tongue with your teeth - Swallow, while holding your tongue with your teeth - Repeat 10-15 times, 2-3 times a day *use a wet spoon if your mouth gets dry*  3. Pitch Raise - Repeat "he", once per second in as high of a pitch as you can - Repeat 20 times, 2-3 times a day  4. Shaker Exercise - head lift (optional) - Lie flat on your back in your bed or on a couch without pillows - Raise your head and look at your feet - KEEP YOUR SHOULDERS DOWN - HOLD FOR 45-60 SECONDS, then lower your head back down - Repeat 3 times, 2-3 times a day  5. Mendelsohn Maneuver - "half swallow" exercise - Start to swallow, and keep your Adam's apple up by squeezing hard with the muscles of the throat - Hold the squeeze for 5-7 seconds and then relax - Repeat 10-15 times, 2-3 times a day *use a wet spoon if your mouth gets dry*  6. Chin pushback - Open your mouth  - Place your fist UNDER your chin near your neck - Tuck your chin and push back with your fist for 5 seconds - Repeat 10 times, 2-3 times a day       7. "Super Swallow"  - Take a breath and hold it  - Bear down (like pushing your bowels)  - Swallow then IMMEDIATELY cough  - Repeat 10 times, 2-3 times a day

## 2020-03-26 ENCOUNTER — Ambulatory Visit
Admission: RE | Admit: 2020-03-26 | Discharge: 2020-03-26 | Disposition: A | Discharge status: Home / Self Care | Payer: Medicare HMO | Source: Ambulatory Visit | Attending: Radiation Oncology | Admitting: Radiation Oncology

## 2020-03-26 DIAGNOSIS — C321 Malignant neoplasm of supraglottis: Secondary | ICD-10-CM | POA: Diagnosis not present

## 2020-03-29 ENCOUNTER — Ambulatory Visit
Admission: RE | Admit: 2020-03-29 | Discharge: 2020-03-29 | Disposition: A | Discharge status: Home / Self Care | Payer: Medicare HMO | Source: Ambulatory Visit | Attending: Radiation Oncology | Admitting: Radiation Oncology

## 2020-03-29 DIAGNOSIS — C321 Malignant neoplasm of supraglottis: Secondary | ICD-10-CM | POA: Diagnosis not present

## 2020-03-30 ENCOUNTER — Ambulatory Visit
Admission: RE | Admit: 2020-03-30 | Discharge: 2020-03-30 | Disposition: A | Discharge status: Home / Self Care | Payer: Medicare HMO | Source: Ambulatory Visit | Attending: Radiation Oncology | Admitting: Radiation Oncology

## 2020-03-30 DIAGNOSIS — C321 Malignant neoplasm of supraglottis: Secondary | ICD-10-CM | POA: Diagnosis not present

## 2020-03-31 ENCOUNTER — Ambulatory Visit
Admission: RE | Admit: 2020-03-31 | Discharge: 2020-03-31 | Disposition: A | Discharge status: Home / Self Care | Payer: Medicare HMO | Source: Ambulatory Visit | Attending: Radiation Oncology | Admitting: Radiation Oncology

## 2020-03-31 ENCOUNTER — Other Ambulatory Visit: Payer: Self-pay

## 2020-03-31 DIAGNOSIS — C321 Malignant neoplasm of supraglottis: Secondary | ICD-10-CM | POA: Diagnosis not present

## 2020-04-01 ENCOUNTER — Ambulatory Visit: Payer: Medicare HMO | Admitting: Nutrition

## 2020-04-01 ENCOUNTER — Ambulatory Visit
Admission: RE | Admit: 2020-04-01 | Discharge: 2020-04-01 | Disposition: A | Discharge status: Home / Self Care | Payer: Medicare HMO | Source: Ambulatory Visit | Attending: Radiation Oncology | Admitting: Radiation Oncology

## 2020-04-01 ENCOUNTER — Other Ambulatory Visit: Payer: Self-pay

## 2020-04-01 DIAGNOSIS — C321 Malignant neoplasm of supraglottis: Secondary | ICD-10-CM | POA: Diagnosis not present

## 2020-04-01 NOTE — Progress Notes (Signed)
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 Final radiation therapy is scheduled for November 17. Patient does not have a feeding tube and is not receiving chemotherapy. Weight documented as 196.2 pounds today.  This is down slightly from 197.4 pounds September 17.  Patient weighed 198.2 pounds last week.   Patient reporting throat irritation with acidic foods. Enjoyed juice-based oral nutrition supplements. Reports increased fatigue. Also complains of decreased taste/taste alterations.  Nutrition diagnosis: Predicted suboptimal energy intake continues.  Estimated nutrition needs: 1850-2050 cal, 100-110 g protein, greater than 2 L fluid daily  Intervention: Provided support and encouragement to patient.  Recommended she continue small, frequent meals and snacks with high-calorie, high-protein foods for weight maintenance. Encourage small bites and sips and asked patient to chew her food well. Recommended she add sauces or gravies as needed to ease swallowing. Continue baking soda and salt water rinses for thickened saliva. Educated patient on tips for minimizing fatigue. Provided additional oral nutrition supplements.  I recommended patient drink 1-2 cartons daily for additional calories and protein to minimize further weight loss. Questions were answered.  Teach back method used.  Monitoring, evaluation, goals: Patient will continue to work to consume adequate calories and protein for minimal weight loss. Patient understands to contact me with questions.  Next visit: Thursday, November 4.  **Disclaimer: This note was dictated with voice recognition software. Similar sounding words can inadvertently be transcribed and this note may contain transcription errors which may not have been corrected upon publication of note.**

## 2020-04-02 ENCOUNTER — Ambulatory Visit
Admission: RE | Admit: 2020-04-02 | Discharge: 2020-04-02 | Disposition: A | Discharge status: Home / Self Care | Payer: Medicare HMO | Source: Ambulatory Visit | Attending: Radiation Oncology | Admitting: Radiation Oncology

## 2020-04-02 DIAGNOSIS — C321 Malignant neoplasm of supraglottis: Secondary | ICD-10-CM | POA: Diagnosis not present

## 2020-04-05 ENCOUNTER — Ambulatory Visit
Admission: RE | Admit: 2020-04-05 | Discharge: 2020-04-05 | Disposition: A | Discharge status: Home / Self Care | Payer: Medicare HMO | Source: Ambulatory Visit | Attending: Radiation Oncology | Admitting: Radiation Oncology

## 2020-04-05 DIAGNOSIS — C321 Malignant neoplasm of supraglottis: Secondary | ICD-10-CM | POA: Insufficient documentation

## 2020-04-06 ENCOUNTER — Ambulatory Visit
Admission: RE | Admit: 2020-04-06 | Discharge: 2020-04-06 | Disposition: A | Discharge status: Home / Self Care | Payer: Medicare HMO | Source: Ambulatory Visit | Attending: Radiation Oncology | Admitting: Radiation Oncology

## 2020-04-06 ENCOUNTER — Other Ambulatory Visit: Payer: Self-pay

## 2020-04-06 DIAGNOSIS — C321 Malignant neoplasm of supraglottis: Secondary | ICD-10-CM | POA: Diagnosis not present

## 2020-04-07 ENCOUNTER — Ambulatory Visit
Admission: RE | Admit: 2020-04-07 | Discharge: 2020-04-07 | Disposition: A | Discharge status: Home / Self Care | Payer: Medicare HMO | Source: Ambulatory Visit | Attending: Radiation Oncology | Admitting: Radiation Oncology

## 2020-04-07 DIAGNOSIS — C321 Malignant neoplasm of supraglottis: Secondary | ICD-10-CM | POA: Diagnosis not present

## 2020-04-08 ENCOUNTER — Ambulatory Visit
Admission: RE | Admit: 2020-04-08 | Discharge: 2020-04-08 | Disposition: A | Discharge status: Home / Self Care | Payer: Medicare HMO | Source: Ambulatory Visit | Attending: Radiation Oncology | Admitting: Radiation Oncology

## 2020-04-08 ENCOUNTER — Inpatient Hospital Stay: Payer: Medicare HMO | Attending: Oncology | Admitting: Nutrition

## 2020-04-08 DIAGNOSIS — C321 Malignant neoplasm of supraglottis: Secondary | ICD-10-CM | POA: Diagnosis not present

## 2020-04-08 NOTE — Progress Notes (Signed)
Nutrition follow up completed with patient after radiation for supraglottis cancer. She reports increased fatigue. She is looking forward to the end of her treatment. Wt stable at 196.6 pounds. She continues to try to eat and find things that taste good.  Reports saliva is getting thicker. She tries to drink a lot of fluids.  Nutrition Diagnosis: Predicted sub optimal energy intake continues.  Intervention: Continue small frequent meals and snacks. Drink 2-3 oral nutrition supplements. Add cool mist humidifier to room at night. Continue salt and baking soda rinses. Provided fact sheet on thick saliva. Provided additional samples.  Monitoring, Evaluation, Goals: Patient will continue to consume adequate calories and protein to minimize wt loss.  Next Visit: Thursday, November 11.  Final radiation therapy scheduled on November 17.

## 2020-04-09 ENCOUNTER — Ambulatory Visit
Admission: RE | Admit: 2020-04-09 | Discharge: 2020-04-09 | Disposition: A | Discharge status: Home / Self Care | Payer: Medicare HMO | Source: Ambulatory Visit | Attending: Radiation Oncology | Admitting: Radiation Oncology

## 2020-04-09 DIAGNOSIS — C321 Malignant neoplasm of supraglottis: Secondary | ICD-10-CM | POA: Diagnosis not present

## 2020-04-12 ENCOUNTER — Other Ambulatory Visit: Payer: Self-pay

## 2020-04-12 ENCOUNTER — Ambulatory Visit
Admission: RE | Admit: 2020-04-12 | Discharge: 2020-04-12 | Disposition: A | Discharge status: Home / Self Care | Payer: Medicare HMO | Source: Ambulatory Visit | Attending: Radiation Oncology | Admitting: Radiation Oncology

## 2020-04-12 DIAGNOSIS — C321 Malignant neoplasm of supraglottis: Secondary | ICD-10-CM | POA: Diagnosis not present

## 2020-04-13 ENCOUNTER — Ambulatory Visit
Admission: RE | Admit: 2020-04-13 | Discharge: 2020-04-13 | Disposition: A | Discharge status: Home / Self Care | Payer: Medicare HMO | Source: Ambulatory Visit | Attending: Radiation Oncology | Admitting: Radiation Oncology

## 2020-04-13 DIAGNOSIS — C321 Malignant neoplasm of supraglottis: Secondary | ICD-10-CM | POA: Diagnosis not present

## 2020-04-14 ENCOUNTER — Ambulatory Visit
Admission: RE | Admit: 2020-04-14 | Discharge: 2020-04-14 | Disposition: A | Discharge status: Home / Self Care | Payer: Medicare HMO | Source: Ambulatory Visit | Attending: Radiation Oncology | Admitting: Radiation Oncology

## 2020-04-14 DIAGNOSIS — C321 Malignant neoplasm of supraglottis: Secondary | ICD-10-CM | POA: Diagnosis not present

## 2020-04-15 ENCOUNTER — Ambulatory Visit
Admission: RE | Admit: 2020-04-15 | Discharge: 2020-04-15 | Disposition: A | Discharge status: Home / Self Care | Payer: Medicare HMO | Source: Ambulatory Visit | Attending: Radiation Oncology | Admitting: Radiation Oncology

## 2020-04-15 ENCOUNTER — Ambulatory Visit: Payer: Medicare HMO | Admitting: Nutrition

## 2020-04-15 DIAGNOSIS — C321 Malignant neoplasm of supraglottis: Secondary | ICD-10-CM | POA: Diagnosis not present

## 2020-04-15 NOTE — Progress Notes (Signed)
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  Weight documented as 194.8 pounds, decreased from 196.6 pounds. She forces herself to eat despite taste alterations. She has no difficulty drinking water. She drinks 3-4 ONS daily. She is excited to be almost done with radiation.  Nutrition Diagnosis: Predicted sub-optimal energy intake continues.  Intervention: Educated to increase ONS to 4 cartons daily Continue soft foods as tolerated. Strive for weight maintenance. Continue salt and baking soda rinses. Provided support.  Monitoring, Evaluation, Goals: Adequate calories and protein for weight maintenance and healing.  Next Visit: November 18 after Speech Therapy.

## 2020-04-16 ENCOUNTER — Ambulatory Visit
Admission: RE | Admit: 2020-04-16 | Discharge: 2020-04-16 | Disposition: A | Discharge status: Home / Self Care | Payer: Medicare HMO | Source: Ambulatory Visit | Attending: Radiation Oncology | Admitting: Radiation Oncology

## 2020-04-16 DIAGNOSIS — C321 Malignant neoplasm of supraglottis: Secondary | ICD-10-CM | POA: Diagnosis not present

## 2020-04-19 ENCOUNTER — Ambulatory Visit
Admission: RE | Admit: 2020-04-19 | Discharge: 2020-04-19 | Disposition: A | Discharge status: Home / Self Care | Payer: Medicare HMO | Source: Ambulatory Visit | Attending: Radiation Oncology | Admitting: Radiation Oncology

## 2020-04-19 DIAGNOSIS — C321 Malignant neoplasm of supraglottis: Secondary | ICD-10-CM | POA: Diagnosis not present

## 2020-04-20 ENCOUNTER — Ambulatory Visit
Admission: RE | Admit: 2020-04-20 | Discharge: 2020-04-20 | Disposition: A | Discharge status: Home / Self Care | Payer: Medicare HMO | Source: Ambulatory Visit | Attending: Radiation Oncology | Admitting: Radiation Oncology

## 2020-04-20 DIAGNOSIS — C321 Malignant neoplasm of supraglottis: Secondary | ICD-10-CM | POA: Diagnosis not present

## 2020-04-21 ENCOUNTER — Ambulatory Visit
Admission: RE | Admit: 2020-04-21 | Discharge: 2020-04-21 | Disposition: A | Discharge status: Home / Self Care | Payer: Medicare HMO | Source: Ambulatory Visit | Attending: Radiation Oncology | Admitting: Radiation Oncology

## 2020-04-21 ENCOUNTER — Encounter: Payer: Self-pay | Admitting: Radiation Oncology

## 2020-04-21 DIAGNOSIS — C321 Malignant neoplasm of supraglottis: Secondary | ICD-10-CM | POA: Diagnosis not present

## 2020-04-21 NOTE — Progress Notes (Signed)
Oncology Nurse Navigator Documentation  Met with Ms. Ridolfi after final RT to offer support and to celebrate end of radiation treatment.   Provided verbal/written post-RT guidance:  Importance of keeping all follow-up appts, especially those with Nutrition and SLP.  Importance of protecting treatment area from sun.  Continuation of Sonafine application 2-3 times daily, application of antibiotic ointment to areas of raw skin; when supply of Sonafine exhausted transition to OTC lotion with vitamin E. Provided/reviewed Epic calendar of upcoming appts. Explained my role as navigator will continue for several more months, encouraged him to call me with needs/concerns.    Harlow Asa RN, BSN, OCN Head & Neck Oncology Nurse [place] at [facility] Phone # [phone]  Fax # [phone]

## 2020-04-22 ENCOUNTER — Ambulatory Visit: Payer: Medicare HMO | Attending: Radiation Oncology

## 2020-04-22 ENCOUNTER — Other Ambulatory Visit: Payer: Self-pay

## 2020-04-22 ENCOUNTER — Inpatient Hospital Stay: Payer: Medicare HMO | Admitting: Nutrition

## 2020-04-22 DIAGNOSIS — R1310 Dysphagia, unspecified: Secondary | ICD-10-CM | POA: Insufficient documentation

## 2020-04-22 DIAGNOSIS — R490 Dysphonia: Secondary | ICD-10-CM | POA: Diagnosis present

## 2020-04-22 NOTE — Progress Notes (Signed)
Nutrition follow-up completed with patient.  She completed radiation therapy yesterday for supraglottis cancer. Weight decreased and was documented as 187.4 pounds today down from 194.8 pounds last week. She continues to try to force herself to eat. She is drinking a lot of water and continues 4 oral nutrition supplements daily. She cannot eat any bread or any meat but is doing the best she can.  Nutrition diagnosis:  Predicted suboptimal energy intake has evolved into inadequate oral intake related to supraglottis cancer and associated treatments as evidenced by no prior need for nutrition related information.  Intervention: Patient educated to continue strategies for small, frequent high-calorie high-protein foods. Continue oral nutrition supplements at least 4 daily. Provided support and encouragement. Questions were answered.  Teach back method used.  Monitoring, evaluation, goals: Patient will tolerate adequate calories and protein to minimize further weight loss and promote healing.  Next visit: Wednesday, December 15 after radiation therapy MD visit.   **Disclaimer: This note was dictated with voice recognition software. Similar sounding words can inadvertently be transcribed and this note may contain transcription errors which may not have been corrected upon publication of note.**

## 2020-04-22 NOTE — Therapy (Signed)
[facility] [address] [place], [place], [address] Phone: [phone]   Fax:  [phone]  Speech Language Pathology Treatment  Patient Details  Name: Sara Farmer MRN: [id number] Date of Birth: 1950-11-06 Referring Provider (SLP): Eppie Gibson, MD   Encounter Date: 04/22/2020   End of Session - 04/22/20 1518    Visit Number 2    Number of Visits 7    Date for SLP Re-Evaluation 06/23/20   90 days   SLP Start Time 1430    SLP Stop Time  1500    SLP Time Calculation (min) 30 min    Activity Tolerance Patient tolerated treatment well           Past Medical History:  Diagnosis Date  . Anxiety   . Depression   . Diabetes mellitus without complication ([place])    Type 2 - Patient denies this DX - No meds  . HLD (hyperlipidemia)   . Hypertension     Past Surgical History:  Procedure Laterality Date  . CARPAL TUNNEL RELEASE Bilateral   . CESAREAN SECTION     x 2  . DIRECT LARYNGOSCOPY N/A 01/28/2020   Procedure: DIRECT LARYNGOSCOPY WITH BIOPSY;  Surgeon: Izora Gala, MD;  Location: [facility];  Service: ENT;  Laterality: N/A;  . EYE SURGERY Bilateral    Lasik  . RIGID ESOPHAGOSCOPY N/A 01/28/2020   Procedure: RIGID ESOPHAGOSCOPY;  Surgeon: Izora Gala, MD;  Location: [facility];  Service: ENT;  Laterality: N/A;  . TUBAL LIGATION      There were no vitals filed for this visit.   Subjective Assessment - 04/22/20 1438    Subjective "I found out that if I drink water and it's cold it works better." Mod-severe strained/strangled voice today, partly likely due to edematous area affected by rad tx.    Currently in Pain? No/denies                 ADULT SLP TREATMENT - 04/22/20 0001      General Information   Behavior/Cognition Alert;Cooperative;Pleasant mood      Treatment Provided   Treatment provided Dysphagia      Dysphagia Treatment   Temperature Spikes Noted No    Respiratory Status Room air     Treatment Methods Skilled observation;Therapeutic exercise;Compensation strategy training;Patient/caregiver education    Oral Phase Signs & Symptoms --   small bites   Pharyngeal Phase Signs & Symptoms Other (comment)   none   Other treatment/comments SLP encouraged pt to speak without pushing. Pt successful for 2 utterances and vocal quality imporved. But then pt begain to push/strain again.  Pt is not eating dense drier foods like meats and bread. Eaten in the last week mac and cheese, creamed potatoes, pudding, fruit cocktail, baked potatoes. Pt reports coughing with canned fruit = SLP told pt to stay away from mixed consistencies and provided pt with a short list of item that she should eliminate for the next 2-3 weeks - canned and fresh fruit, cereal with milk, soup with broth and solids, ice cream/popsicles. Pt voiced understanding. SLP rmeinded pt to perform HEP BID - pt stated she was "doing (the HEP) as much as (she) can." but was ont more specific than this. She req'd min A occasionally with HEP procedure, and min A for rationale for HEP.      Assessment / Recommendations / Plan   Plan Continue with current plan of care      Progression Toward Goals  Progression toward goals Progressing toward goals            SLP Education - 04/22/20 1517    Education Details mixed consistency items, try them again in 2-3 weeks, HEP procedure, rationale for HEP    Person(s) Educated Patient    Methods Explanation;Demonstration;Verbal cues;Handout    Comprehension Verbalized understanding;Returned demonstration;Verbal cues required;Need further instruction            SLP Short Term Goals - 04/22/20 1520      SLP SHORT TERM GOAL #1   Title pt will complete HEP with rare min A    Time 1    Period --   sessions, for all STGs   Status On-going      SLP SHORT TERM GOAL #2   Title pt will tell SLP why pt is completing HEP with modified independence x2 sessions    Time 1    Status On-going        SLP SHORT TERM GOAL #3   Title pt will tell SLP how a food journal could hasten return to a more normalized diet    Time 2    Status On-going            SLP Long Term Goals - 04/22/20 1521      SLP LONG TERM GOAL #1   Title pt will complete HEP with modified independence over two visits    Time 3    Period --   sessions, for all LTGs   Status On-going      SLP LONG TERM GOAL #2   Title pt will describe 3 overt s/s aspiration PNA with modified independence    Time 3    Status On-going      SLP LONG TERM GOAL #3   Title pt will describe how to modify HEP over time, and the timeline associated with reduction in HEP frequency with modified independence over two sessions    Time 5    Status On-going            Plan - 04/22/20 1518    Clinical Impression Statement Sara Farmer presents today with swallowing essentially WNL,with water, and when taking smaller bites with solids. She currently tolerates dys II, III, and some regular foods Pt's swallow deemed WNL at this time.for smaller bites, and thin liquids. SLP reviewed the individualized HEP for dysphagia and pt completed each exercise on their own with min A faded to modified independent. Data indicate that pt's swallow ability will likely decrease over the course of radiation therapy and could very well decline over time following conclusion of their radiation therapy due to muscle disuse atrophy and/or muscle fibrosis. Pt will cont to need to be seen by SLP in order to assess safety of PO intake, assess the need for recommending any objective swallow assessment, and ensuring pt correctly completes the individualized HEP.    Speech Therapy Frequency --   once approx every 4 weeks   Duration --   7 total visits   Treatment/Interventions Aspiration precaution training;Pharyngeal strengthening exercises;Diet toleration management by SLP;Trials of upgraded texture/liquids;Compensatory techniques;Internal/external aids;Patient/family  education;SLP instruction and feedback    Potential to Achieve Goals Good    SLP Home Exercise Plan provided    Consulted and Agree with Plan of Care Patient           Patient will benefit from skilled therapeutic intervention in order to improve the following deficits and impairments:   Dysphagia, unspecified type  Voice hoarseness  Problem List Patient Active Problem List   Diagnosis Date Noted  . Malignant neoplasm of supraglottis ([place]) 02/20/2020    [place] ,[place], [place]  04/22/2020, 3:22 PM  [facility] [address] [place], [place], [address] Phone: [phone]   Fax:  [phone]   Name: Sara Farmer MRN: [id number] Date of Birth: [birth year]

## 2020-04-26 ENCOUNTER — Ambulatory Visit: Payer: Medicare HMO | Admitting: Physical Therapy

## 2020-05-18 ENCOUNTER — Ambulatory Visit: Payer: Medicare HMO | Attending: Radiation Oncology | Admitting: Physical Therapy

## 2020-05-18 ENCOUNTER — Other Ambulatory Visit: Payer: Self-pay

## 2020-05-18 ENCOUNTER — Encounter: Payer: Self-pay | Admitting: Physical Therapy

## 2020-05-18 DIAGNOSIS — R293 Abnormal posture: Secondary | ICD-10-CM

## 2020-05-18 DIAGNOSIS — R490 Dysphonia: Secondary | ICD-10-CM | POA: Insufficient documentation

## 2020-05-18 DIAGNOSIS — C321 Malignant neoplasm of supraglottis: Secondary | ICD-10-CM | POA: Diagnosis present

## 2020-05-18 DIAGNOSIS — R1310 Dysphagia, unspecified: Secondary | ICD-10-CM | POA: Insufficient documentation

## 2020-05-18 NOTE — Therapy (Signed)
[facility], [place], [address] Phone: [phone]   Fax:  [phone]  Physical Therapy Treatment  Patient Details  Name: Sara Farmer MRN: [id number] Date of Birth: 1951-01-31 Referring Provider (PT): Reita May Date: 05/18/2020   PT End of Session - 05/18/20 1149    Visit Number 2    Number of Visits 2    Date for PT Re-Evaluation 05/06/20    PT Start Time 1106    PT Stop Time 1140    PT Time Calculation (min) 34 min    Activity Tolerance Patient tolerated treatment well    Behavior During Therapy [facility] for tasks assessed/performed           Past Medical History:  Diagnosis Date  . Anxiety   . Depression   . Diabetes mellitus without complication ([place])    Type 2 - Patient denies this DX - No meds  . HLD (hyperlipidemia)   . Hypertension     Past Surgical History:  Procedure Laterality Date  . CARPAL TUNNEL RELEASE Bilateral   . CESAREAN SECTION     x 2  . DIRECT LARYNGOSCOPY N/A 01/28/2020   Procedure: DIRECT LARYNGOSCOPY WITH BIOPSY;  Surgeon: Izora Gala, MD;  Location: [facility];  Service: ENT;  Laterality: N/A;  . EYE SURGERY Bilateral    Lasik  . RIGID ESOPHAGOSCOPY N/A 01/28/2020   Procedure: RIGID ESOPHAGOSCOPY;  Surgeon: Izora Gala, MD;  Location: [facility];  Service: ENT;  Laterality: N/A;  . TUBAL LIGATION      There were no vitals filed for this visit.   Subjective Assessment - 05/18/20 1108    Subjective I feel like my throat is feeling wider. My voice is coming back some. I got burned from radiation but it is getting better. I did not see any swelling on the outside. I am not having any trouble with my ROM.    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    Currently in Pain? No/denies    Pain Score 0-No pain              [facility] PT Assessment - 05/18/20 0001      Balance Screen   Has the patient fallen in the past 6 months No    Has the patient had a decrease in activity level because of a fear of falling?  No    Is the patient reluctant to leave their home because of a fear of falling?  No      Prior Function   Level of Independence Independent    Leisure pt reports she has been walking but does fatigue easier      Cognition   Overall Cognitive Status Within Functional Limits for tasks assessed      Sit to Stand   Comments 30 sec sit to stand: 13- excellent is considered 16 at her age      AROM   Cervical Flexion WFL    Cervical Extension [facility]    Cervical - Right Side [facility]    Cervical - Left Side [facility]    Cervical - Right Rotation [facility]    Cervical - Left Rotation [facility]  LYMPHEDEMA/ONCOLOGY QUESTIONNAIRE - 05/18/20 0001      Head and Neck   4 cm superior to sternal notch around neck 37 cm    6 cm superior to sternal notch around neck 37.2 cm    8 cm superior to sternal notch around neck 37.8 cm                                   PT Long Term Goals - 05/18/20 1200      PT LONG TERM GOAL #1   Title Pt will return to baseline ROM measurements and not demonstrate any signs of lymphedema to allow pt to return to PLOF.    Time 8    Period Weeks    Status Achieved                 Plan - 05/18/20 1151    Clinical Impression Statement Pt returned to PT after completion of radiation for treatment of squamous cell carcinoma of supraglottis. Her shoulder ROM and cervical ROM are still WFL. She demonstrates no lymphedema in her neck. Her 30 sec sit to stand decreased to 13 from 18 at eval. Pt reports she has been more fatigued since completion of radiation but continues to do her daily activities. She has been trying to walk  frequently every day. She does seem slightly off balance at times. Discussed with pt how skilled PT services can help to reduce fatigue and improve balance but pt is not interested at this time because she feels she is working on it at home and it is improving. Pt states she will reach out in 2-3 weeks and let therapist know if she feels she needs PT if she is still having trouble with balance and fatigue.    PT Frequency --   eval and 1 f/u   PT Duration 8 weeks    PT Treatment/Interventions ADLs/Self Care Home Management;Therapeutic exercise;Patient/family education    PT Next Visit Plan dc this visit    PT Home Exercise Plan head and neck ROM exercises    Consulted and Agree with Plan of Care Patient           Patient will benefit from skilled therapeutic intervention in order to improve the following deficits and impairments:  Postural dysfunction,Decreased knowledge of precautions  Visit Diagnosis: Abnormal posture  Malignant neoplasm of supraglottis [facility])     Problem List Patient Active Problem List   Diagnosis Date Noted  . Malignant neoplasm of supraglottis ([place]) 02/20/2020    Allyson Sabal [facility] 05/18/2020, 12:00 PM  [place], [place], [address] Phone: [phone]   Fax:  [phone]  Name: Sara Farmer MRN: [id number] Date of Birth: 09-12-1950  PHYSICAL THERAPY DISCHARGE SUMMARY  Visits from Start of Care: 2  Current functional level related to goals / functional outcomes: Goals met   Remaining deficits: Pt will some balance deficits observed when pt was turning around but pt does not want to begin therapy for that at this time- see above for more details   Education / Equipment: Lymphedema education  Plan: Patient agrees to discharge.  Patient goals were met. Patient is being discharged due to meeting the stated rehab goals.  ?????    Allyson Sabal [place],  [place] 05/18/20 12:03 PM

## 2020-05-19 ENCOUNTER — Ambulatory Visit
Admission: RE | Admit: 2020-05-19 | Discharge: 2020-05-19 | Disposition: A | Discharge status: Home / Self Care | Payer: Medicare HMO | Source: Ambulatory Visit | Attending: Radiation Oncology | Admitting: Radiation Oncology

## 2020-05-19 ENCOUNTER — Encounter: Payer: Medicare HMO | Admitting: Nutrition

## 2020-05-19 ENCOUNTER — Other Ambulatory Visit: Payer: Self-pay

## 2020-05-19 ENCOUNTER — Inpatient Hospital Stay: Payer: Medicare HMO | Attending: Oncology

## 2020-05-19 VITALS — BP 123/63 | HR 83 | Temp 97.3°F | Resp 18 | Wt 174.2 lb

## 2020-05-19 DIAGNOSIS — C321 Malignant neoplasm of supraglottis: Secondary | ICD-10-CM

## 2020-05-19 DIAGNOSIS — Z79899 Other long term (current) drug therapy: Secondary | ICD-10-CM | POA: Diagnosis not present

## 2020-05-19 DIAGNOSIS — R5383 Other fatigue: Secondary | ICD-10-CM | POA: Insufficient documentation

## 2020-05-19 DIAGNOSIS — Z923 Personal history of irradiation: Secondary | ICD-10-CM | POA: Insufficient documentation

## 2020-05-19 NOTE — Progress Notes (Signed)
Nutrition Follow-up:  Patient with supraglottis cancer.  Patient completed radiation treatment on 11/17.    Spoke with patient via phone.  Patient reports that she is trying to eat different foods. Still has to take small bites and chew well. Unable to tolerate breads.  Eating soups, cheese, mashed potatoes.  Drinking about 1 chocolate boost plus shake per day.  "I can't afford those shakes."    Medications: reviewed  Labs: reviewed  Anthropometrics:   Weight 174 lb today decreased from 187 lb    NUTRITION DIAGNOSIS: Inadequate oral intake continues   INTERVENTION:  Reviewed soft moist foods high in protein and calories Encouraged small frequent meals Will provide complimentary case of ensure for patient to pick up tomorrow. She is agreeable.      MONITORING, EVALUATION, GOAL: weight trends, intake   NEXT VISIT: Jan 11 phone with Jerrell Belfast B. Zenia Resides, [place], [place] Registered Dietitian [phone] (mobile)

## 2020-05-19 NOTE — Progress Notes (Signed)
Ms. Handa presents today for follow-up after completing radiation to her supraglottis on 04/21/2020  Pain issues, if any: Patient denies. Occasional sore/dry throat, but over all no concerns Using a feeding tube?: N/A Weight changes, if any: scheduled for telephone check in with Joli Allen-RD today Wt Readings from Last 3 Encounters:  05/19/20 174 lb 3.2 oz (79 kg)  02/27/20 196 lb 2 oz (89 kg)  02/20/20 197 lb 6.4 oz (89.5 kg)   Swallowing issues, if any: Still not able to tolerate bread or drier foods. She can tolerate small bites of meat and softer foods. Occasionally gets strangled on thin liquids. Scheduled to see Bridgett Larsson tomorrow 05/20/2020 Smoking or chewing tobacco? None Using fluoride trays daily? N/A Last ENT visit was on: Not since diagnosis Other notable issues, if any: Still feels fatigued and has to rest throughout the day. Continues to deal with vocal hoarseness, dry mouth, thick saliva, and lack of taste. Denies any ear or jaw pain, or trouble opening her mouth. Denies any mouth sores or ulcers. Skin appears well healed in treatment field. Lymphedema in neck greatly improved, and was signed off by PT yesterday. Overall she is pleased with her progress thus far  Vitals:   05/19/20 1141  BP: 123/63  Pulse: 83  Resp: 18  Temp: (!) 97.3 F (36.3 C)  SpO2: 99%

## 2020-05-20 ENCOUNTER — Ambulatory Visit: Payer: Medicare HMO

## 2020-05-20 DIAGNOSIS — R1310 Dysphagia, unspecified: Secondary | ICD-10-CM

## 2020-05-20 DIAGNOSIS — R293 Abnormal posture: Secondary | ICD-10-CM | POA: Diagnosis not present

## 2020-05-20 DIAGNOSIS — R490 Dysphonia: Secondary | ICD-10-CM

## 2020-05-20 NOTE — Therapy (Signed)
[facility] [address] [place], [place], [address] Phone: [phone]   Fax:  [phone]  Speech Language Pathology Treatment  Patient Details  Name: Sara Farmer MRN: [id number] Date of Birth: December 25, 1950 Referring Provider (SLP): Eppie Gibson, MD   Encounter Date: 05/20/2020   End of Session - 05/20/20 1457    Visit Number 3    Number of Visits 7    Date for SLP Re-Evaluation 06/23/20   90 days   SLP Start Time 91    SLP Stop Time  1400    SLP Time Calculation (min) 42 min    Activity Tolerance Patient tolerated treatment well           Past Medical History:  Diagnosis Date  . Anxiety   . Depression   . Diabetes mellitus without complication ([place])    Type 2 - Patient denies this DX - No meds  . HLD (hyperlipidemia)   . Hypertension     Past Surgical History:  Procedure Laterality Date  . CARPAL TUNNEL RELEASE Bilateral   . CESAREAN SECTION     x 2  . DIRECT LARYNGOSCOPY N/A 01/28/2020   Procedure: DIRECT LARYNGOSCOPY WITH BIOPSY;  Surgeon: Izora Gala, MD;  Location: [facility];  Service: ENT;  Laterality: N/A;  . EYE SURGERY Bilateral    Lasik  . RIGID ESOPHAGOSCOPY N/A 01/28/2020   Procedure: RIGID ESOPHAGOSCOPY;  Surgeon: Izora Gala, MD;  Location: [facility];  Service: ENT;  Laterality: N/A;  . TUBAL LIGATION      There were no vitals filed for this visit.   Subjective Assessment - 05/20/20 1323    Subjective "Don't get mad I didn't do the exercises - I haven't had time."    Currently in Pain? No/denies                 ADULT SLP TREATMENT - 05/20/20 1326      General Information   Behavior/Cognition Alert;Cooperative;Pleasant mood      Treatment Provided   Treatment provided Dysphagia      Dysphagia Treatment   Temperature Spikes Noted No    Respiratory Status Room air    Treatment Methods Skilled observation;Therapeutic exercise;Compensation strategy  training;Patient/caregiver education    Patient observed directly with PO's Yes    Type of PO's observed Dysphagia 1 (puree);Thin liquids   pr politely refused sandwich and stick cheese   Oral Phase Signs & Symptoms Other (comment)   none noted   Pharyngeal Phase Signs & Symptoms Other (comment)   None   Other treatment/comments Pt admitted to suboptimal completion of HEP- SLP reminded pt to complete BID. Pt stated with liquids she "occasionally coughs" but further reports that this occurs as she still (same thing reported previous session) holds water in teh mouth and she knows this - she suggested that she just swallow and not perform oral holding - and SLP congratuated her on this insight from previous session. She is eating and drinking mostly softer foods - rare drier or more bready food. As pt had strained/strangled voice SLP again reminded pt to keep voice relaxed so as to inhibit developing MTD. Pt recalled we had talked about that last session and said she would try to do better.      Assessment / Recommendations / Plan   Plan Continue with current plan of care      Dysphagia Recommendations   Diet recommendations --   as tolerated   Medication Administration --  as tolerated           SLP Education - 05/20/20 1456    Education Details don't hold water in mouth - swallow, HEP procedure, HEP rationale    Person(s) Educated Patient    Methods Explanation;Demonstration;Verbal cues;Handout    Comprehension Verbalized understanding;Returned demonstration;Verbal cues required;Need further instruction            SLP Short Term Goals - 05/20/20 1511      SLP SHORT TERM GOAL #1   Title pt will complete HEP with rare min A    Time 1    Period --   sessions, for all STGs   Status Not Met      SLP SHORT TERM GOAL #2   Title pt will tell SLP why pt is completing HEP with modified independence x2 sessions    Time 1    Status Not Met      SLP SHORT TERM GOAL #3   Title pt will  tell SLP how a food journal could hasten return to a more normalized diet    Time 1    Status On-going            SLP Long Term Goals - 05/20/20 1513      SLP LONG TERM GOAL #1   Title pt will complete HEP with rare min A over two visits    Time 2    Period --   sessions, for all LTGs   Status Revised      SLP LONG TERM GOAL #2   Title pt will describe 3 overt s/s aspiration PNA with modified independence    Time 2    Status On-going      SLP LONG TERM GOAL #3   Title pt will describe how to modify HEP over time, and the timeline associated with reduction in HEP frequency with modified independence over two sessions    Time 4    Status On-going            Plan - 05/20/20 1458    Clinical Impression Statement Sara Farmer presents today with swallowing essentially WNL,with water and applesauce (politely refused cheese or sandwich).She stated again that she holds water in her mouth on purpose and swallows piecemeal - when she does this she coughs. SLP again encouraged her to take less per sip, and just swallow She currently tolerates dys II, III, and some regular foods. Pt's swallow deemed WNL at this time with dys I, II and soft foods, and thin liquids. SLP reviewed the individualized HEP for dysphagia and pt completed each exercise on her own with min A faded to modified independent. Data indicate that pt's swallow ability will likely decrease over the course of radiation therapy and could very well decline over time following conclusion of their radiation therapy due to muscle disuse atrophy and/or muscle fibrosis. Pt will cont to need to be seen by SLP in order to assess safety of PO intake, assess the need for recommending any objective swallow assessment, and ensuring pt correctly completes the individualized HEP.    Speech Therapy Frequency --   once approx every 4 weeks   Duration --   7 total visits   Treatment/Interventions Aspiration precaution training;Pharyngeal strengthening  exercises;Diet toleration management by SLP;Trials of upgraded texture/liquids;Compensatory techniques;Internal/external aids;Patient/family education;SLP instruction and feedback    Potential to Achieve Goals Good    SLP Home Exercise Plan provided    Consulted and Agree with Plan of Care Patient  Patient will benefit from skilled therapeutic intervention in order to improve the following deficits and impairments:   Dysphagia, unspecified type  Voice hoarseness    Problem List Patient Active Problem List   Diagnosis Date Noted  . Malignant neoplasm of supraglottis ([place]) 02/20/2020    [place] ,[place], [place]  05/20/2020, 3:15 PM  [facility] [address] [place], [place], [address] Phone: [phone]   Fax:  [phone]   Name: Sara Farmer MRN: [id number] Date of Birth: [birth year]

## 2020-05-21 ENCOUNTER — Encounter: Payer: Self-pay | Admitting: Radiation Oncology

## 2020-05-21 NOTE — Progress Notes (Signed)
Radiation Oncology         [phone] ________________________________  Name: Sara Farmer MRN: [id number]  Date: 05/19/2020  DOB: February 10, 1951  Follow-Up Visit Note  CC: Chesley Noon, MD  Chesley Noon, MD  Diagnosis and Prior Radiotherapy:       ICD-10-CM   1. 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   Sara Farmer presents today for follow-up after completing radiation to her supraglottis on 04/21/2020  Pain issues, if any: Patient denies. Occasional sore/dry throat, but over all no concerns Using a feeding tube?: N/A Weight changes, if any: scheduled for telephone check in with Joli Allen-RD today Wt Readings from Last 3 Encounters:  05/19/20 174 lb 3.2 oz (79 kg)  02/27/20 196 lb 2 oz (89 kg)  02/20/20 197 lb 6.4 oz (89.5 kg)   Swallowing issues, if any: Still not able to tolerate bread or drier foods. She can tolerate small bites of meat and softer foods. Occasionally gets strangled on thin liquids. Scheduled to see Bridgett Larsson tomorrow 05/20/2020 Smoking or chewing tobacco? None Using fluoride trays daily? N/A Last ENT visit was on: Not since diagnosis Other notable issues, if any: Still feels fatigued and has to rest throughout the day. Continues to deal with vocal hoarseness, dry mouth, thick saliva, and lack of taste. Denies any ear or jaw pain, or trouble opening her mouth. Denies any mouth sores or ulcers. Skin appears well healed in treatment field. Lymphedema in neck greatly improved, and was signed off by PT yesterday. Overall she is pleased with her progress thus far  Vitals:   05/19/20 1141  BP: 123/63  Pulse: 83  Resp: 18  Temp: (!) 97.3 F (36.3 C)  SpO2: 99%                       ALLERGIES:  is allergic to penicillins and sulfa antibiotics.  Meds: Current Outpatient Medications   Medication Sig Dispense Refill  . albuterol (VENTOLIN HFA) 108 (90 Base) MCG/ACT inhaler Inhale 2 puffs into the lungs as needed. (Patient not taking: Reported on 02/25/2020)    . amLODipine (NORVASC) 10 MG tablet Take 10 mg by mouth daily.    Marland Kitchen buPROPion (WELLBUTRIN SR) 150 MG 12 hr tablet Start one week before quit date. Take 1 tab daily x 3 days, then 1 tab BID thereafter. 60 tablet 2  . Ibuprofen-diphenhydrAMINE HCl (ADVIL PM) 200-25 MG CAPS Take 1 tablet by mouth at bedtime as needed (Sleep).    Marland Kitchen lidocaine (XYLOCAINE) 2 % solution Patient: Mix 1part 2% viscous lidocaine, 1part H20. Swish & swallow 4mL of diluted mixture, 84min before meals and at bedtime, up to QID 200 mL 3  . nicotine (NICODERM CQ - DOSED IN MG/24 HOURS) 14 mg/24hr patch Apply 1 patch to skin daily for 6 weeks. (Patient not taking: Reported on 02/25/2020) 14 patch 2  . nicotine (NICODERM CQ - DOSED IN MG/24 HR) 7 mg/24hr patch Apply 1 patch to skin daily for 2 weeks. Finish Rx for 14mg  patch, first. (Patient not taking: Reported on 02/25/2020) 14 patch 0  . PARoxetine (PAXIL) 40 MG tablet Take 40 mg by mouth daily.    . simvastatin (ZOCOR) 40 MG tablet Take 40 mg by mouth at bedtime.    . valsartan-hydrochlorothiazide (DIOVAN-HCT) 320-25 MG tablet Take 1 tablet  by mouth daily.     No current facility-administered medications for this encounter.    Physical Findings: The patient is in no acute distress. Patient is alert and oriented. Wt Readings from Last 3 Encounters:  05/19/20 174 lb 3.2 oz (79 kg)  02/27/20 196 lb 2 oz (89 kg)  02/20/20 197 lb 6.4 oz (89.5 kg)    weight is 174 lb 3.2 oz (79 kg). Her temporal temperature is 97.3 F (36.3 C) (abnormal). Her blood pressure is 123/63 and her pulse is 83. Her respiration is 18 and oxygen saturation is 99%. .  General: Alert and oriented, in no acute distress HEENT: Head is normocephalic. Extraocular movements are intact. Oropharynx is notable for no lesions in the mouth  or upper throat Neck: Neck is notable for no palpable adenopathy Skin: Skin in treatment fields shows satisfactory healing  Psychiatric: Judgment and insight are intact. Affect is appropriate.   Lab Findings: Lab Results  Component Value Date   WBC 14.2 (H) 01/28/2020   HGB 15.8 (H) 01/28/2020   HCT 47.5 (H) 01/28/2020   MCV 87.0 01/28/2020   PLT 362 01/28/2020    Lab Results  Component Value Date   TSH 2.237 02/26/2020    Radiographic Findings: No results found.  Impression/Plan:    1) Head and Neck Cancer Status: Healing well from radiation therapy  2) Nutritional Status: Continue to follow advice of nutrition, stabilizing Wt Readings from Last 3 Encounters:  05/19/20 174 lb 3.2 oz (79 kg)  02/27/20 196 lb 2 oz (89 kg)  02/20/20 197 lb 6.4 oz (89.5 kg)   PEG tube: None  3) Risk Factors: The patient has been educated about risk factors including alcohol and tobacco abuse; they understand that avoidance of alcohol and tobacco is important to prevent recurrences as well as other cancers  4) Swallowing: Good function, continue exercises from speech-language pathology  5) Dental: Encouraged to continue regular followup with dentistry, and dental hygiene including fluoride rinses.   6) Thyroid function:  Lab Results  Component Value Date   TSH 2.237 02/26/2020    7)   Follow-up in 3 months with restaging PET scan. The patient was encouraged to call with any issues or questions before then.  On date of service, in total, I spent 25 minutes on this encounter. Patient was seen in person. _____________________________________   Eppie Gibson, MD

## 2020-05-24 ENCOUNTER — Ambulatory Visit: Payer: Medicare HMO | Admitting: Physical Therapy

## 2020-05-26 NOTE — Progress Notes (Signed)
  Patient Name: [name] MRN: [id number] DOB: Jan 30, 1951 Referring Physician: Anastasia Pall (Profile Not Attached) Date of Service: 04/21/2020 [facility], [place]                                                        End Of Treatment Note  Diagnoses: C32.1-Malignant neoplasm of supraglottis  Cancer Staging: Cancer Staging Malignant neoplasm of supraglottis [facility]) Staging form: Larynx - Supraglottis, AJCC 8th Edition - Clinical stage from 02/20/2020: Stage II (cT2, cN0, cM0) - Signed by Eppie Gibson, MD on 02/20/2020   Intent: Curative  Radiation Treatment Dates: 03/04/2020 through 04/21/2020 Site Technique Total Dose (Gy) Dose per Fx (Gy) Completed Fx Beam Energies  Larynx: HN_supragl IMRT 70/70 2 35/35 6X   Narrative: The patient tolerated radiation therapy relatively well.   Plan: The patient will follow-up with radiation oncology in 2-3 wks.  -----------------------------------  Eppie Gibson, MD

## 2020-06-15 ENCOUNTER — Inpatient Hospital Stay: Payer: Medicare HMO | Attending: Oncology | Admitting: Nutrition

## 2020-06-15 NOTE — Progress Notes (Signed)
Telephone follow-up completed with patient status postradiation therapy on November 17 for supraglottis cancer. Patient reports she feels she has "turned the corner" and is eating a little better. Reports last weight at MD office was 177 pounds.  This would be a slight increase from 174 pounds December 15. Patient continues to try to eat small amounts of food often and is tolerating soft pured/chopped foods.  She is really enjoying chicken and greens. She does not enjoy sweet foods.  She has been drinking oral nutrition supplements however has not consumed one this week. She has no nutrition concerns at this time.  Nutrition diagnosis: Inadequate oral intake improved.  Intervention: Educated patient to continue strategies for adequate calorie and protein intake. Recommended patient continue to have oral nutrition supplements available to supplement oral intake. Encouraged weight maintenance. Questions answered.  Monitoring, evaluation, goals: Patient will tolerate adequate calories and protein for weight maintenance.  Patient will contact RD for further questions or concerns.  **Disclaimer: This note was dictated with voice recognition software. Similar sounding words can inadvertently be transcribed and this note may contain transcription errors which may not have been corrected upon publication of note.**

## 2020-06-16 ENCOUNTER — Other Ambulatory Visit (HOSPITAL_COMMUNITY): Payer: Medicare HMO | Admitting: Dentistry

## 2020-06-24 ENCOUNTER — Ambulatory Visit: Payer: Medicare HMO

## 2020-07-26 ENCOUNTER — Other Ambulatory Visit: Payer: Self-pay

## 2020-07-26 ENCOUNTER — Encounter (HOSPITAL_COMMUNITY)
Admission: RE | Admit: 2020-07-26 | Discharge: 2020-07-26 | Disposition: A | Discharge status: Home / Self Care | Payer: Medicare HMO | Source: Ambulatory Visit | Attending: Radiation Oncology | Admitting: Radiation Oncology

## 2020-07-26 DIAGNOSIS — D259 Leiomyoma of uterus, unspecified: Secondary | ICD-10-CM | POA: Insufficient documentation

## 2020-07-26 DIAGNOSIS — K8020 Calculus of gallbladder without cholecystitis without obstruction: Secondary | ICD-10-CM | POA: Insufficient documentation

## 2020-07-26 DIAGNOSIS — C321 Malignant neoplasm of supraglottis: Secondary | ICD-10-CM

## 2020-07-26 DIAGNOSIS — I700 Atherosclerosis of aorta: Secondary | ICD-10-CM | POA: Insufficient documentation

## 2020-07-26 LAB — GLUCOSE, CAPILLARY: Glucose-Capillary: 110 mg/dL — ABNORMAL HIGH (ref 70–99)

## 2020-07-26 MED ORDER — FLUDEOXYGLUCOSE F - 18 (FDG) INJECTION
8.6400 | Freq: Once | INTRAVENOUS | Status: AC | PRN
  Administered 2020-07-26: 8.64 via INTRAVENOUS

## 2020-07-27 ENCOUNTER — Ambulatory Visit
Admission: RE | Admit: 2020-07-27 | Discharge: 2020-07-27 | Disposition: A | Discharge status: Home / Self Care | Payer: Medicare HMO | Source: Ambulatory Visit | Attending: Radiation Oncology | Admitting: Radiation Oncology

## 2020-07-27 ENCOUNTER — Other Ambulatory Visit: Payer: Self-pay

## 2020-07-27 VITALS — BP 136/86 | HR 72 | Temp 97.5°F | Resp 18 | Wt 178.2 lb

## 2020-07-27 DIAGNOSIS — R5381 Other malaise: Secondary | ICD-10-CM

## 2020-07-27 DIAGNOSIS — C321 Malignant neoplasm of supraglottis: Secondary | ICD-10-CM | POA: Diagnosis present

## 2020-07-27 NOTE — Progress Notes (Signed)
Ms. Kilgour presents today for follow-up after completing radiation to her supraglottis on 04/21/2020, and to review PET scan results from 07/26/2020  Pain issues, if any: Patient denies  Using a feeding tube?: N/A Weight changes, if any:  Wt Readings from Last 3 Encounters:  07/27/20 178 lb 3.2 oz (80.8 kg)  05/19/20 174 lb 3.2 oz (79 kg)  02/27/20 196 lb 2 oz (89 kg)   Swallowing issues, if any: Patient denies. She doesn't enjoy eating sweets, and reports she can't tolerate hot beverages, but otherwise feels she can eat a wide variety of food/drink Smoking or chewing tobacco? None Using fluoride trays daily? N/A Last ENT visit was on: Not since diagnosis Other notable issues, if any: Reports vocal hoarseness has improved. Continues to deal with dry mouth, thick saliva, and altered sense of taste. Denies any issues with swelling/lymphedmea to her neck or jaw. Denies any ear or jaw pain, or trouble opening her mouth/turning her head side-to-side. Fatigue has improved, though she still tires more easily. Denies any difficulty sleeping.

## 2020-07-27 NOTE — Progress Notes (Signed)
Oncology Nurse Navigator Documentation  I met with Ms. Schoolfield before and during her follow up with Dr. Isidore Moos today. She is feeling well and pleased with the results of her PET scan from yesterday. She has been scheduled to see Dr. Isidore Moos in 4 months with a TSH lab to be completed before the appointment. A fax requesting an appointment for Ms. Bertagnolli in one month with Dr. Constance Holster has been sent to his office with confirmation of successful fax transmission received. Ms. Hesler knows to call me if she has any further questions or concerns.   Harlow Asa RN, BSN, OCN Head & Neck Oncology Nurse [place] at [facility] Phone # [phone]  Fax # [phone]

## 2020-08-02 ENCOUNTER — Encounter: Payer: Self-pay | Admitting: Radiation Oncology

## 2020-08-02 NOTE — Progress Notes (Signed)
Radiation Oncology         [phone] ________________________________  Name: Sara Farmer MRN: [id number]  Date: 07/27/2020  DOB: [birth year]  Follow-Up Visit Note  CC: Sara Noon, MD  Sara Noon, MD  Diagnosis and Prior Radiotherapy:       ICD-10-CM   1. Malignant neoplasm of supraglottis [facility])  C32.1     Radiation Treatment Dates: 03/04/2020 through 04/21/2020 Site Technique Total Dose (Gy) Dose per Fx (Gy) Completed Fx Beam Energies  Larynx: HN_supragl IMRT 70/70 2 35/35 6X   CHIEF COMPLAINT:  Here for follow-up and surveillance of throat cancer  Narrative:    Sara Farmer presents today for follow-up after completing radiation to her supraglottis on 04/21/2020, and to review PET scan results from 07/26/2020  Pain issues, if any: Patient denies  Using a feeding tube?: N/A Weight changes, if any:  Wt Readings from Last 3 Encounters:  07/27/20 178 lb 3.2 oz (80.8 kg)  05/19/20 174 lb 3.2 oz (79 kg)  02/27/20 196 lb 2 oz (89 kg)   Swallowing issues, if any: Patient denies. She doesn't enjoy eating sweets, and reports she can't tolerate hot beverages, but otherwise feels she can eat a wide variety of food/drink Smoking or chewing tobacco? None Using fluoride trays daily? N/A Last ENT visit was on: Not since diagnosis Other notable issues, if any: Reports vocal hoarseness has improved. Continues to deal with dry mouth, thick saliva, and altered sense of taste. Denies any issues with swelling/lymphedmea to her neck or jaw. Denies any ear or jaw pain, or trouble opening her mouth/turning her head side-to-side. Fatigue has improved, though she still tires more easily. Denies any difficulty sleeping.    Vitals:   07/27/20 1446  BP: 136/86  Pulse: 72  Resp: 18  Temp: (!) 97.5 F (36.4 C)  SpO2: 99%                       ALLERGIES:  is allergic to penicillins and sulfa antibiotics.  Meds: Current Outpatient Medications  Medication Sig Dispense Refill   . albuterol (VENTOLIN HFA) 108 (90 Base) MCG/ACT inhaler Inhale 2 puffs into the lungs as needed. (Patient not taking: Reported on 02/25/2020)    . amLODipine (NORVASC) 10 MG tablet Take 10 mg by mouth daily.    Marland Kitchen buPROPion (WELLBUTRIN SR) 150 MG 12 hr tablet Start one week before quit date. Take 1 tab daily x 3 days, then 1 tab BID thereafter. 60 tablet 2  . Ibuprofen-diphenhydrAMINE HCl (ADVIL PM) 200-25 MG CAPS Take 1 tablet by mouth at bedtime as needed (Sleep).    Marland Kitchen lidocaine (XYLOCAINE) 2 % solution Patient: Mix 1part 2% viscous lidocaine, 1part H20. Swish & swallow 60mL of diluted mixture, 58min before meals and at bedtime, up to QID 200 mL 3  . nicotine (NICODERM CQ - DOSED IN MG/24 HOURS) 14 mg/24hr patch Apply 1 patch to skin daily for 6 weeks. (Patient not taking: Reported on 02/25/2020) 14 patch 2  . nicotine (NICODERM CQ - DOSED IN MG/24 HR) 7 mg/24hr patch Apply 1 patch to skin daily for 2 weeks. Finish Rx for 14mg  patch, first. (Patient not taking: Reported on 02/25/2020) 14 patch 0  . PARoxetine (PAXIL) 40 MG tablet Take 40 mg by mouth daily.    . simvastatin (ZOCOR) 40 MG tablet Take 40 mg by mouth at bedtime.    . valsartan-hydrochlorothiazide (DIOVAN-HCT) 320-25 MG tablet Take 1 tablet by mouth daily.  No current facility-administered medications for this encounter.    Physical Findings: The patient is in no acute distress. Patient is alert and oriented. Wt Readings from Last 3 Encounters:  07/27/20 178 lb 3.2 oz (80.8 kg)  05/19/20 174 lb 3.2 oz (79 kg)  02/27/20 196 lb 2 oz (89 kg)    weight is 178 lb 3.2 oz (80.8 kg). Her tympanic temperature is 97.5 F (36.4 C) (abnormal). Her blood pressure is 136/86 and her pulse is 72. Her respiration is 18 and oxygen saturation is 99%. .  General: Alert and oriented, in no acute distress HEENT: Head is normocephalic. Extraocular movements are intact. Oropharynx is notable for no lesions in the mouth or upper throat Neck: Neck is  notable for no palpable adenopathy Skin: Skin in treatment fields shows satisfactory healing  Psychiatric: Judgment and insight are intact. Affect is appropriate.   Lab Findings: Lab Results  Component Value Date   WBC 14.2 (H) 01/28/2020   HGB 15.8 (H) 01/28/2020   HCT 47.5 (H) 01/28/2020   MCV 87.0 01/28/2020   PLT 362 01/28/2020    Lab Results  Component Value Date   TSH 2.237 02/26/2020    Radiographic Findings: NM PET Image Restag (PS) Skull Base To Thigh  Result Date: 07/27/2020 CLINICAL DATA:  subsequent treatment strategy for restaging of malignant neoplasm of supraglottis. COVID vaccine x3 in right arm. EXAM: NUCLEAR MEDICINE PET SKULL BASE TO THIGH TECHNIQUE: 8.6 mCi F-18 FDG was injected intravenously. Full-ring PET imaging was performed from the skull base to thigh after the radiotracer. CT data was obtained and used for attenuation correction and anatomic localization. Fasting blood glucose: 110 mg/dl COMPARISON:  02/16/2020 PET.  Neck CT of 01/22/2020. FINDINGS: Mediastinal blood pool activity: SUV max 2.7 Liver activity: SUV max Not applicable. NECK: No residual supraglottic hypermetabolism identified. No well-defined residual mass. Glottic hypermetabolism is likely physiologic or due to phonation after radiopharmaceutical injection. No cervical nodal hypermetabolism. Low-level right-sided thyroid hypermetabolism may correspond to a 8 mm nodule. Example at a S.U.V. max of 4.6 on 44/4. No left-sided hypermetabolism to correspond to the 1.4 cm nodule described on prior exams. Incidental CT findings: Bilateral carotid atherosclerosis. CHEST: No pulmonary parenchymal or thoracic nodal hypermetabolism. Incidental CT findings: Mild cardiomegaly.  Aortic atherosclerosis. ABDOMEN/PELVIS: No abdominopelvic parenchymal or nodal hypermetabolism. Incidental CT findings: Mild left adrenal thickening and nodularity are unchanged. Upper pole right renal 7.0 cm dominant cyst. Abdominal aortic  atherosclerosis. 8 mm gallstone. Prominent right hepatic lobe. Colonic stool burden suggests constipation. Calcified uterine fundal fibroid. Central uterine hypoattenuation including at up to 1.9 cm on 166/4. Retroverted uterus. SKELETON: No abnormal marrow activity. Incidental CT findings: Left maxillary sinus mucous retention cyst or polyp. IMPRESSION: 1. Complete metabolic response to therapy of supraglottic laryngeal mass. 2. Mild right-sided thyroid hypermetabolism may correspond with a subcentimeter nodule. The thyroid was evaluated on 03/19/2020. Recommend follow-up ultrasound on or after 03/19/2021. Alternatively, if follow-up PET is planned, this area can be re-evaluated on those exams. 3. Central uterine hypoattenuation. Correlate with postmenopausal bleeding. If any symptoms, recommend dedicated ultrasound. 4. Incidental findings, including: Cholelithiasis. Aortic Atherosclerosis (ICD10-I70.0). Uterine fibroid. Electronically Signed   By: Abigail Miyamoto M.D.   On: 07/27/2020 08:30    Impression/Plan:    1) Head and Neck Cancer Status: NED - I personally reviewed her PET scan.  She will be reviewed at tumor board and referred back to otolaryngology for posttreatment laryngoscopy  2) Nutritional Status: stabilizing  Wt Readings from Last  3 Encounters:  07/27/20 178 lb 3.2 oz (80.8 kg)  05/19/20 174 lb 3.2 oz (79 kg)  02/27/20 196 lb 2 oz (89 kg)   PEG tube: None  3) Risk Factors: The patient has been educated about risk factors including alcohol and tobacco abuse; they understand that avoidance of alcohol and tobacco is important to prevent recurrences as well as other cancers  4) Swallowing: Good function, continue exercises from speech-language pathology  5) Dental: Encouraged to continue regular followup with dentistry, and dental hygiene including fluoride rinses.   6) Thyroid function:  Lab Results  Component Value Date   TSH 2.237 02/26/2020    7)   She will be scheduled to  see me in 4 months with a TSH lab to be completed before the appointment. A fax requesting an appointment for Ms. Bevacqua in one month with Dr. Constance Holster has been sent to his office with confirmation of successful fax transmission received. Ms. Kusch knows to our department if she has any further questions or concerns.   On date of service, in total, I spent 25 minutes on this encounter. Patient was seen in person. _____________________________________   Eppie Gibson, MD

## 2020-11-15 ENCOUNTER — Telehealth: Payer: Self-pay | Admitting: *Deleted

## 2020-11-15 NOTE — Telephone Encounter (Signed)
RETURNED PATIENT'S PHONE CALL, SPOKE WITH PATIENT. ?

## 2020-11-23 ENCOUNTER — Ambulatory Visit
Admission: RE | Admit: 2020-11-23 | Discharge: 2020-11-23 | Disposition: A | Discharge status: Home / Self Care | Payer: Medicare HMO | Source: Ambulatory Visit | Attending: Radiation Oncology | Admitting: Radiation Oncology

## 2020-11-23 ENCOUNTER — Other Ambulatory Visit: Payer: Self-pay

## 2020-11-23 VITALS — BP 131/72 | HR 72 | Temp 97.8°F | Resp 20 | Ht 60.0 in | Wt 175.2 lb

## 2020-11-23 DIAGNOSIS — Z923 Personal history of irradiation: Secondary | ICD-10-CM | POA: Diagnosis not present

## 2020-11-23 DIAGNOSIS — Z79890 Hormone replacement therapy (postmenopausal): Secondary | ICD-10-CM | POA: Insufficient documentation

## 2020-11-23 DIAGNOSIS — R5381 Other malaise: Secondary | ICD-10-CM | POA: Diagnosis not present

## 2020-11-23 DIAGNOSIS — I890 Lymphedema, not elsewhere classified: Secondary | ICD-10-CM | POA: Insufficient documentation

## 2020-11-23 DIAGNOSIS — Z79899 Other long term (current) drug therapy: Secondary | ICD-10-CM | POA: Diagnosis not present

## 2020-11-23 DIAGNOSIS — Z791 Long term (current) use of non-steroidal anti-inflammatories (NSAID): Secondary | ICD-10-CM | POA: Diagnosis not present

## 2020-11-23 DIAGNOSIS — C321 Malignant neoplasm of supraglottis: Secondary | ICD-10-CM

## 2020-11-23 DIAGNOSIS — J384 Edema of larynx: Secondary | ICD-10-CM | POA: Diagnosis not present

## 2020-11-23 DIAGNOSIS — Z85818 Personal history of malignant neoplasm of other sites of lip, oral cavity, and pharynx: Secondary | ICD-10-CM | POA: Insufficient documentation

## 2020-11-23 DIAGNOSIS — R5383 Other fatigue: Secondary | ICD-10-CM | POA: Insufficient documentation

## 2020-11-23 NOTE — Progress Notes (Signed)
Ms. Riendeau presents today for follow-up after completing radiation to her supraglottis on 04/21/2020  Pain issues, if any: Patient denies. Occasional sore/dry throat, but over all no concerns Using a feeding tube?: N/A Weight changes, if any:  Wt Readings from Last 3 Encounters:  11/23/20 175 lb 3.2 oz (79.5 kg)  07/27/20 178 lb 3.2 oz (80.8 kg)  05/19/20 174 lb 3.2 oz (79 kg)   Swallowing issues, if any: Patient denies.  Smoking or chewing tobacco? None Using fluoride trays daily? N/A Last ENT visit was on: 11/02/2020 Saw Dr. Izora Gala:  --"Physical Exam:  She is in no distress. Her voice is rough and strained. Her breathing is clear. Right ear canal is clear and healthy and the drum is intact with a healthy middle ear. There is some waxy buildup on the left but no signs of infection. Oral cavity and pharynx are clear. Indirect laryngoscopy reveals diffuse supraglottic edema and mobile cords, no change compared to last visit. There is no palpable adenopathy --Impression & Plans:  Stable, no evidence of recurrent disease. Unable to tolerate steroids. Persistent radiation-induced laryngeal edema. No breathing or swallowing problems just vocal problems. She feels that she is slowly improving. No further action needed. Continue surveillance every 3 months or sooner if needed. Avoid using Q-tips in the ears."  Other notable issues, if any: Continues to deal fatigue, dry throat, and vocal horseness. Denies any ear or jaw pain, or difficulty opening his mouth. Reports mild lymphedema under chin. Overall she reports she feels well and is pleased with her progress so far  Vitals:   11/23/20 1352  BP: 131/72  Pulse: 72  Resp: 20  Temp: 97.8 F (36.6 C)  SpO2: 99%

## 2020-11-24 ENCOUNTER — Encounter: Payer: Self-pay | Admitting: Radiation Oncology

## 2020-11-24 ENCOUNTER — Other Ambulatory Visit: Payer: Self-pay | Admitting: Radiation Oncology

## 2020-11-24 ENCOUNTER — Other Ambulatory Visit: Payer: Self-pay

## 2020-11-24 DIAGNOSIS — E038 Other specified hypothyroidism: Secondary | ICD-10-CM

## 2020-11-24 DIAGNOSIS — C321 Malignant neoplasm of supraglottis: Secondary | ICD-10-CM | POA: Diagnosis not present

## 2020-11-24 LAB — TSH: TSH: 11.047 u[IU]/mL — ABNORMAL HIGH (ref 0.308–3.960)

## 2020-11-24 LAB — T4, FREE: Free T4: 0.56 ng/dL — ABNORMAL LOW (ref 0.61–1.12)

## 2020-11-24 MED ORDER — LEVOTHYROXINE SODIUM 25 MCG PO TABS: ORAL_TABLET | ORAL | 2 refills | Status: DC

## 2020-11-24 NOTE — Progress Notes (Signed)
Radiation Oncology         [phone] ________________________________  Name: Sara Farmer MRN: [id number]  Date: 11/23/2020  DOB: [birth year]  Follow-Up Visit Note  CC: Chesley Noon, MD  Chesley Noon, MD  Diagnosis and Prior Radiotherapy:       ICD-10-CM   1. 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 Spells presents today for follow-up after completing radiation to her supraglottis on 04/21/2020  Pain issues, if any: Patient denies. Occasional sore/dry throat, but over all no concerns Using a feeding tube?: N/A Weight changes, if any:  Wt Readings from Last 3 Encounters:  11/23/20 175 lb 3.2 oz (79.5 kg)  07/27/20 178 lb 3.2 oz (80.8 kg)  05/19/20 174 lb 3.2 oz (79 kg)   Swallowing issues, if any: Patient denies.  Smoking or chewing tobacco? None Using fluoride trays daily? N/A Last ENT visit was on: 11/02/2020 Saw Dr. Izora Gala:  --"Physical Exam:  She is in no distress. Her voice is rough and strained. Her breathing is clear. Right ear canal is clear and healthy and the drum is intact with a healthy middle ear. There is some waxy buildup on the left but no signs of infection. Oral cavity and pharynx are clear. Indirect laryngoscopy reveals diffuse supraglottic edema and mobile cords, no change compared to last visit. There is no palpable adenopathy --Impression & Plans:  Stable, no evidence of recurrent disease. Unable to tolerate steroids. Persistent radiation-induced laryngeal edema. No breathing or swallowing problems just vocal problems. She feels that she is slowly improving. No further action needed. Continue surveillance every 3 months or sooner if needed. Avoid using Q-tips  in the ears."  Other notable issues, if any: Continues to deal fatigue, dry throat, and vocal horseness. Denies any ear or jaw pain, or difficulty opening his mouth. Reports mild lymphedema under chin. Overall she reports she feels well and is pleased with her progress so far   Vitals:   11/23/20 1352  BP: 131/72  Pulse: 72  Resp: 20  Temp: 97.8 F (36.6 C)  SpO2: 99%                       ALLERGIES:  is allergic to penicillins and sulfa antibiotics.  Meds: Current Outpatient Medications  Medication Sig Dispense Refill   albuterol (VENTOLIN HFA) 108 (90 Base) MCG/ACT inhaler Inhale 2 puffs into the lungs as needed. (Patient not taking: Reported on 02/25/2020)     amLODipine (NORVASC) 10 MG tablet Take 10 mg by mouth daily.     buPROPion (WELLBUTRIN SR) 150 MG 12 hr tablet Start one week before quit date. Take 1 tab daily x 3 days, then 1 tab BID thereafter. 60 tablet 2   Ibuprofen-diphenhydrAMINE HCl (ADVIL PM) 200-25 MG CAPS Take 1 tablet by mouth at bedtime as needed (Sleep).     levothyroxine (SYNTHROID) 25 MCG tablet Take 1 tab PO QAM, 1 hour before breakfast, coffee, or other meds. 90 tablet 2   lidocaine (XYLOCAINE) 2 % solution PATIENT: MIX 1PART 2% VISCOUS LIDOCAINE, 1PART H20. SWISH & SWALLOW 10ML OF DILUTED MIXTURE, 30MIN BEFORE MEALS AND AT BEDTIME, UP TO FOUR TIMES DAILY  200 mL 3   nicotine (NICODERM CQ - DOSED IN MG/24 HOURS) 14 mg/24hr patch Apply 1 patch to skin daily for 6 weeks. (Patient not taking: Reported on 02/25/2020) 14 patch 2   nicotine (NICODERM CQ - DOSED IN MG/24 HR) 7 mg/24hr patch Apply 1 patch to skin daily for 2 weeks. Finish Rx for 14mg  patch, first. (Patient not taking: Reported on 02/25/2020) 14 patch 0   PARoxetine (PAXIL) 40 MG tablet Take 40 mg by mouth daily.     simvastatin (ZOCOR) 40 MG tablet Take 40 mg by mouth at bedtime.     valsartan-hydrochlorothiazide (DIOVAN-HCT) 320-25 MG tablet Take 1 tablet by mouth daily.     No current  facility-administered medications for this encounter.    Physical Findings: The patient is in no acute distress. Patient is alert and oriented. Wt Readings from Last 3 Encounters:  11/23/20 175 lb 3.2 oz (79.5 kg)  07/27/20 178 lb 3.2 oz (80.8 kg)  05/19/20 174 lb 3.2 oz (79 kg)    height is 5' (1.524 m) and weight is 175 lb 3.2 oz (79.5 kg). Her temperature is 97.8 F (36.6 C). Her blood pressure is 131/72 and her pulse is 72. Her respiration is 20 and oxygen saturation is 99%. .  General: Alert and oriented, in no acute distress; hoarse HEENT: Head is normocephalic. Extraocular movements are intact. Oropharynx is notable for no lesions in the mouth or upper throat Neck: Neck is notable for no palpable adenopathy Skin: Skin in treatment fields shows satisfactory healing  Psychiatric: Judgment and insight are intact. Affect is appropriate. Heart RRR Chest CTAB  Lab Findings: Lab Results  Component Value Date   WBC 14.2 (H) 01/28/2020   HGB 15.8 (H) 01/28/2020   HCT 47.5 (H) 01/28/2020   MCV 87.0 01/28/2020   PLT 362 01/28/2020    Lab Results  Component Value Date   TSH 11.047 (H) 11/23/2020   Free T4 today is 0.56  Radiographic Findings: No results found.   Impression/Plan:    1) Head and Neck Cancer Status: NED   2) Nutritional Status: stable  Wt Readings from Last 3 Encounters:  11/23/20 175 lb 3.2 oz (79.5 kg)  07/27/20 178 lb 3.2 oz (80.8 kg)  05/19/20 174 lb 3.2 oz (79 kg)   PEG tube: None  3) Risk Factors: The patient has been educated about risk factors including alcohol and tobacco abuse; they understand that avoidance of alcohol and tobacco is important to prevent recurrences as well as other cancers - she is not smoking - I applauded her for this  4) Swallowing: Good function, continue exercises from speech-language pathology  5) Dental: Encouraged to continue regular followup with dentistry, and dental hygiene including fluoride rinses.   6)  Thyroid function: Her TSH and free T4 are abnormal today.  I am starting her on levothyroxine 25 mcg.  Recommend repeat TSH when she sees Dr. Melford Aase in 1 month Lab Results  Component Value Date   TSH 11.047 (H) 11/23/2020    7)   She will see Dr. Constance Holster in September and I will see her at the end of November.  On date of service, in total, I spent 30 minutes on this encounter. Patient was seen in person. _____________________________________   Eppie Gibson, MD

## 2020-11-26 ENCOUNTER — Telehealth: Payer: Self-pay

## 2020-11-26 NOTE — Telephone Encounter (Signed)
-----   Message from Eppie Gibson, MD sent at 11/24/2020  5:22 PM EDT ----- Hello! Her labs warrant starting supplements for hypothyroidism so I started her on levothyroxine 25 mcg. I prescribed to her [facility] in [place].  Can you remind her to Take 1 tab PO QAM, 1 hour before breakfast, coffee, or other meds.   Also, can you tell her that she should get her TSH rechecked when she sees Dr. Melford Aase, her family provider, in 1 month as scheduled?  Do you mind also calling his nurse to ask her to order a TSH for this purpose?   Thanks! Sarah ----- Message ----- From: Buel Ream, Lab [facility] [place] Sent: 11/24/2020   3:58 PM EDT To: Eppie Gibson, MD

## 2020-11-26 NOTE — Telephone Encounter (Signed)
Confirmed with nurse at Dr. Fayrene Fearing practice that repeat TSH will be drawn to when patient has F/U with him in July

## 2021-05-03 ENCOUNTER — Ambulatory Visit
Admission: RE | Admit: 2021-05-03 | Discharge: 2021-05-03 | Disposition: A | Discharge status: Home / Self Care | Payer: Medicare HMO | Source: Ambulatory Visit | Attending: Radiation Oncology | Admitting: Radiation Oncology

## 2021-05-03 ENCOUNTER — Other Ambulatory Visit: Payer: Self-pay

## 2021-05-03 ENCOUNTER — Encounter: Payer: Self-pay | Admitting: Radiation Oncology

## 2021-05-03 VITALS — BP 160/93 | HR 70 | Temp 96.2°F | Resp 18 | Ht 60.0 in | Wt 187.2 lb

## 2021-05-03 DIAGNOSIS — Z85818 Personal history of malignant neoplasm of other sites of lip, oral cavity, and pharynx: Secondary | ICD-10-CM | POA: Insufficient documentation

## 2021-05-03 DIAGNOSIS — Z08 Encounter for follow-up examination after completed treatment for malignant neoplasm: Secondary | ICD-10-CM | POA: Diagnosis not present

## 2021-05-03 DIAGNOSIS — Z923 Personal history of irradiation: Secondary | ICD-10-CM | POA: Diagnosis present

## 2021-05-03 DIAGNOSIS — C321 Malignant neoplasm of supraglottis: Secondary | ICD-10-CM

## 2021-05-03 MED ORDER — OXYMETAZOLINE HCL 0.05 % NA SOLN
2.0000 | Freq: Once | NASAL | Status: AC
  Administered 2021-05-03: 2 via NASAL
  Filled 2021-05-03: qty 30

## 2021-05-03 NOTE — Progress Notes (Signed)
Radiation Oncology         [phone] ________________________________  Name: Sara Farmer MRN: [id number]  Date: 05/03/2021  DOB: 1951/03/23  Follow-Up Visit Note  CC: Chesley Noon, MD  Chesley Noon, MD  Diagnosis and Prior Radiotherapy:       ICD-10-CM   1. Malignant neoplasm of supraglottis ([place])  C32.1 oxymetazoline (AFRIN) 0.05 % nasal spray 2 spray    Fiberoptic laryngoscopy      Radiation Treatment Dates: 03/04/2020 through 04/21/2020 Site Technique Total Dose (Gy) Dose per Fx (Gy) Completed Fx Beam Energies  Larynx: HN_supragl IMRT 70/70 2 35/35 6X   CHIEF COMPLAINT:  Here for follow-up and surveillance of throat cancer  Narrative:  Sara Farmer presents today for follow-up after completing radiation to her supraglottis on 04/21/2020 and she is doing well overall.  She is in good spirits.  Pain issues, if any: No Using a feeding tube?: N/A Weight changes, if any: No  Swallowing issues, if any: No Smoking or chewing tobacco? No Using fluoride trays daily? N/A (dentures?) No Last ENT visit was on: Not since visit with Dr. Izora Gala on 11/02/2020 Other notable issues, if any: Insomina    ALLERGIES:  is allergic to penicillins and sulfa antibiotics.  Meds: Current Outpatient Medications  Medication Sig Dispense Refill   albuterol (VENTOLIN HFA) 108 (90 Base) MCG/ACT inhaler Inhale 2 puffs into the lungs as needed. (Patient not taking: Reported on 02/25/2020)     amLODipine (NORVASC) 10 MG tablet Take 10 mg by mouth daily.     buPROPion (WELLBUTRIN SR) 150 MG 12 hr tablet Start one week before quit date. Take 1 tab daily x 3 days, then 1 tab BID thereafter. 60 tablet 2   Ibuprofen-diphenhydrAMINE HCl (ADVIL PM) 200-25 MG CAPS Take 1 tablet by mouth at bedtime as needed (Sleep).     levothyroxine (SYNTHROID) 25 MCG tablet Take 1 tab PO QAM, 1 hour before breakfast, coffee, or other meds. 90 tablet 2   nicotine (NICODERM CQ - DOSED IN MG/24 HOURS) 14  mg/24hr patch Apply 1 patch to skin daily for 6 weeks. (Patient not taking: Reported on 02/25/2020) 14 patch 2   nicotine (NICODERM CQ - DOSED IN MG/24 HR) 7 mg/24hr patch Apply 1 patch to skin daily for 2 weeks. Finish Rx for 14mg  patch, first. (Patient not taking: Reported on 02/25/2020) 14 patch 0   PARoxetine (PAXIL) 40 MG tablet Take 40 mg by mouth daily.     simvastatin (ZOCOR) 40 MG tablet Take 40 mg by mouth at bedtime.     valsartan-hydrochlorothiazide (DIOVAN-HCT) 320-25 MG tablet Take 1 tablet by mouth daily.     No current facility-administered medications for this encounter.    Physical Findings: The patient is in no acute distress. Patient is alert and oriented. Wt Readings from Last 3 Encounters:  05/03/21 187 lb 4 oz (84.9 kg)  11/23/20 175 lb 3.2 oz (79.5 kg)  07/27/20 178 lb 3.2 oz (80.8 kg)    height is 5' (1.524 m) and weight is 187 lb 4 oz (84.9 kg). Her temporal temperature is 96.2 F (35.7 C) (abnormal). Her blood pressure is 160/93 (abnormal) and her pulse is 70. Her respiration is 18 and oxygen saturation is 100%. .  General: Alert and oriented, in no acute distress; hoarse HEENT: Head is normocephalic. Extraocular movements are intact. Oropharynx is notable for no lesions in the mouth or upper throat Neck: Neck is notable for no palpable adenopathy Skin: Skin  in treatment fields shows satisfactory healing  Psychiatric: Judgment and insight are intact. Affect is appropriate. Heart RRR Chest CTAB  PROCEDURE NOTE: After obtaining consent and spraying nasal cavity with topical oxymetazoline, lidocaine gel was applied to the endoscope and the flexible endoscope was introduced and passed through the nasal cavity.  The nasopharynx, oropharynx, hypopharynx, and larynx  were then examined. No lesions appreciated in the mucosal axis. The true cords were symmetrically mobile.  She tolerated the procedure well   Lab Findings: Lab Results  Component Value Date   WBC 14.2  (H) 01/28/2020   HGB 15.8 (H) 01/28/2020   HCT 47.5 (H) 01/28/2020   MCV 87.0 01/28/2020   PLT 362 01/28/2020    Lab Results  Component Value Date   TSH 11.047 (H) 11/23/2020      Radiographic Findings: No results found.   Impression/Plan:    1) Head and Neck Cancer Status: NED   2) Nutritional Status: stable  Wt Readings from Last 3 Encounters:  05/03/21 187 lb 4 oz (84.9 kg)  11/23/20 175 lb 3.2 oz (79.5 kg)  07/27/20 178 lb 3.2 oz (80.8 kg)   PEG tube: None  3) Risk Factors: The patient has been educated about risk factors including alcohol and tobacco abuse; they understand that avoidance of alcohol and tobacco is important to prevent recurrences as well as other cancers - she is not smoking - I applauded her for this  4) Swallowing: Good function, continue exercises from speech-language pathology  5) Dental: Encouraged to continue regular followup with dentistry, and dental hygiene including fluoride rinses.   6) Thyroid function: She is taking levothyroxine 25 mcg.   Dr. Melford Aase is following lab work in his clinic and will address refills as appropriate  7)   Disposition: It is great to see her doing so well.  She will follow-up with otolaryngology in 3 months and see me in 7 months.  On date of service, in total, I spent 30 minutes on this encounter. Patient was seen in person. _____________________________________   Eppie Gibson, MD

## 2021-05-03 NOTE — Progress Notes (Signed)
Ms. Sara Farmer presents today for follow-up after completing radiation to her supraglottis on 04/21/2020  Pain issues, if any: No Using a feeding tube?: N/A Weight changes, if any: No  Swallowing issues, if any: No Smoking or chewing tobacco? No Using fluoride trays daily? N/A (dentures?) No Last ENT visit was on: Not since visit with Dr. Izora Gala on 11/02/2020 Other notable issues, if any: Insomina

## 2021-05-04 NOTE — Progress Notes (Signed)
Oncology Nurse Navigator Documentation   Per patient's 11/29 post-treatment follow-up with Dr. Isidore Moos, sent fax to [facility] E[facility] Scheduling with request Ms. Mcguffee be contacted and scheduled for routine post-RT follow-up with Dr. Constance Holster in 3 months.  Notification of successful fax transmission received.   Harlow Asa RN, BSN, OCN Head & Neck Oncology Nurse [place] at [facility] Phone # [phone]  Fax # [phone]

## 2021-05-06 IMAGING — CT NM PET TUM IMG INITIAL (PI) SKULL BASE T - THIGH
7 series · 25 of 25 positions shown · non-contrast
Comparison: 01/22/2020 neck CT

CLINICAL DATA: Initial treatment strategy for laryngeal cancer.

EXAM:
NUCLEAR MEDICINE PET SKULL BASE TO THIGH
TECHNIQUE: 9.9 mCi F-18 FDG was injected intravenously. Full-ring PET imaging
was performed from the skull base to thigh after the radiotracer. CT
data was obtained and used for attenuation correction and anatomic
localization.
Fasting blood glucose: 145 mg/dl

[Series 3: pet hn_sk_thigh ac · axial · 5.0mm · 4.07mm/px · z∈[-763,+93]mm · 6 of 215 slices shown]
[im 1/215]
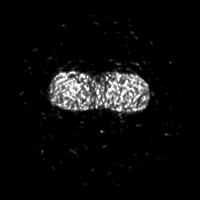
[im 43/215]
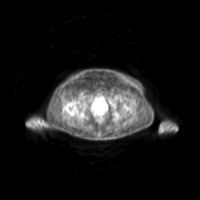
[im 86/215]
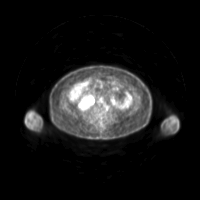
[im 129/215]
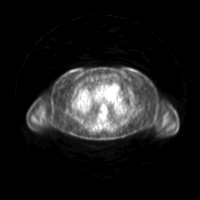
[im 172/215]
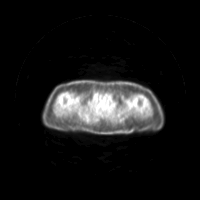
[im 215/215]
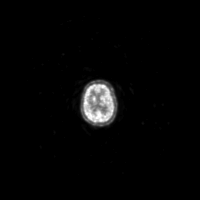

[Series 4: ct hn_sk_th 5.0 hd_fov · axial · 5.0mm · 1.07mm/px · z∈[-763,+93]mm · 5 of 215 slices shown]
[im 1/215]
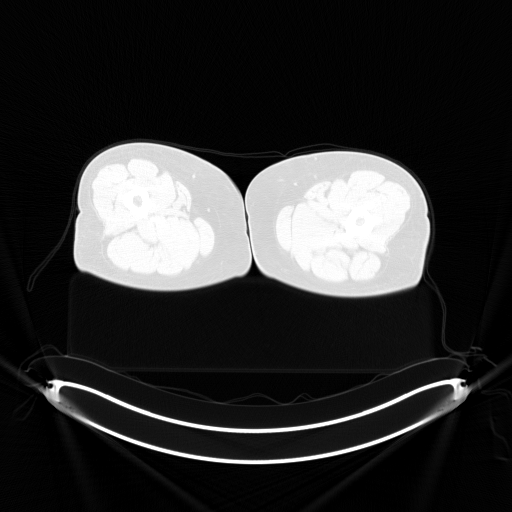
[im 54/215]
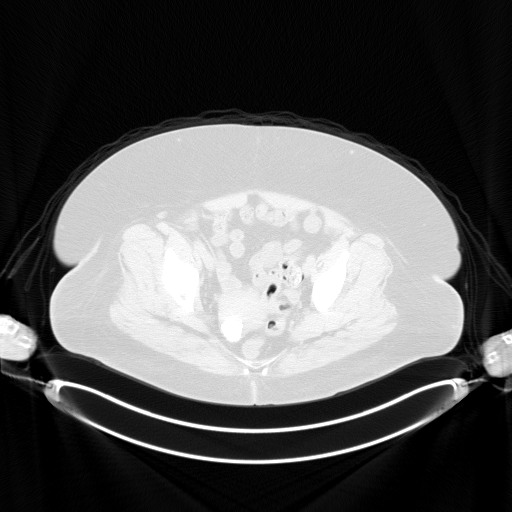
[im 108/215]
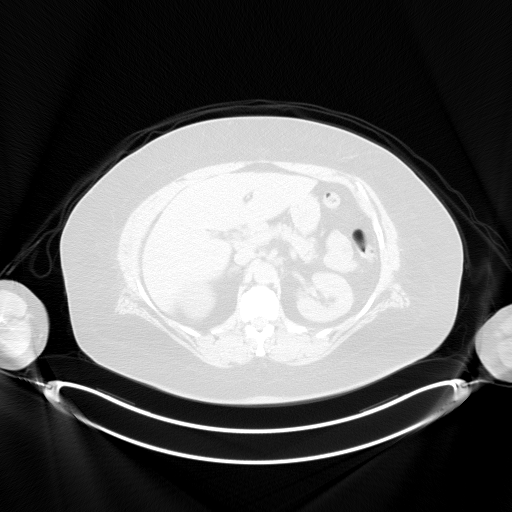
[im 161/215]
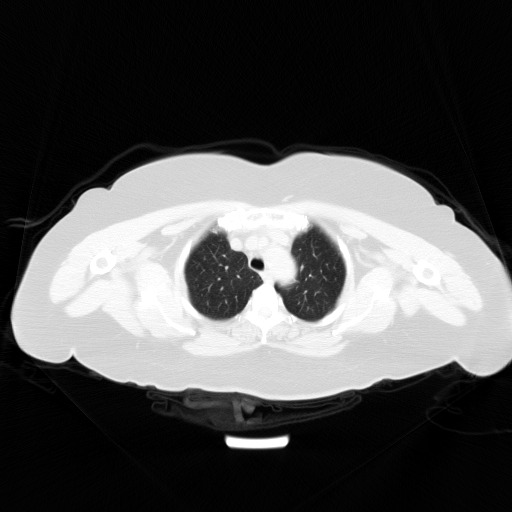
[im 215/215  brain]
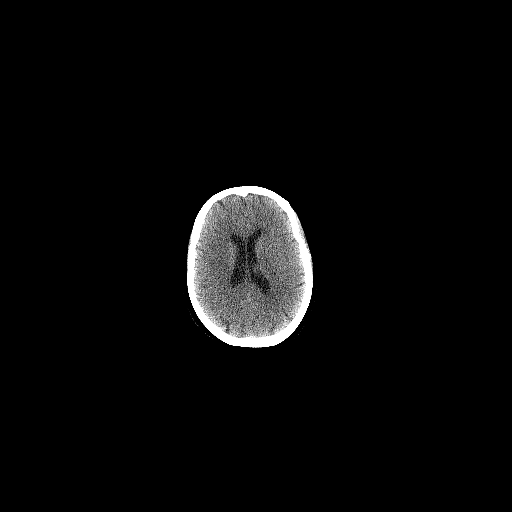

[Series 5: pet hn_sk_thigh nac · axial · 5.0mm · 4.07mm/px · z∈[-763,+93]mm · 5 of 215 slices shown]
[im 1/215]
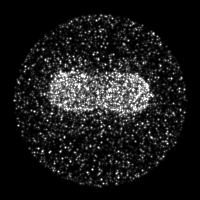
[im 54/215]
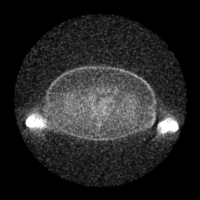
[im 108/215]
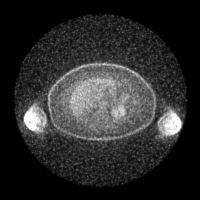
[im 161/215]
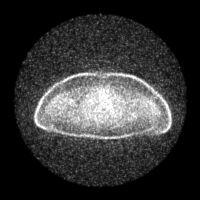
[im 215/215]
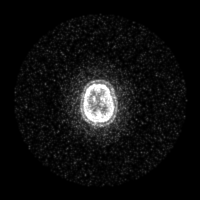

[Series 8: ct hn_sk_th 5.0 (id) lung_bone · axial · 5.0mm · 0.54mm/px · 1 of 56 slices shown]
[im 1/56  bone]
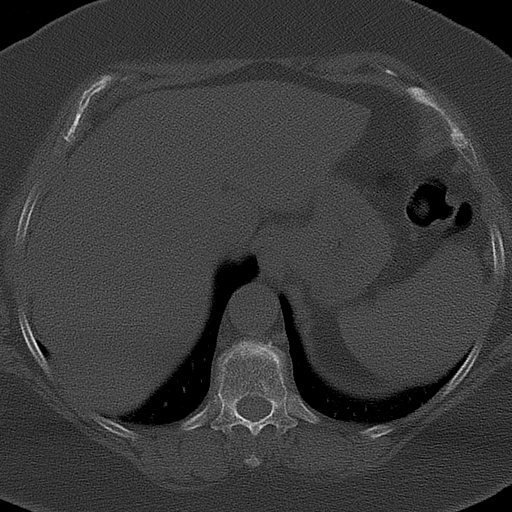

[Series 603: range-ct hn_sk_th 5.0 hd_fov-cor-<alpha range> · 2 of 89 slices shown]
[im 1/89]
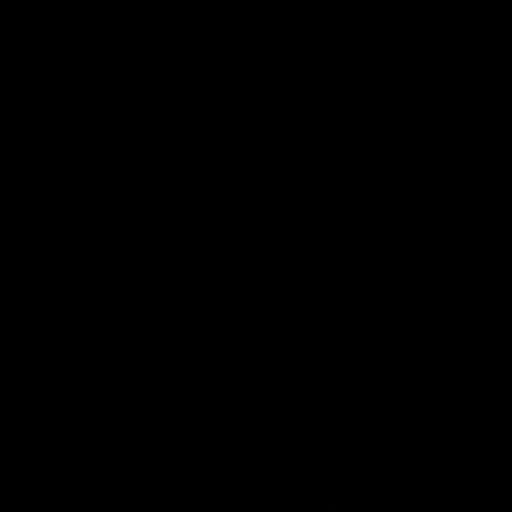
[im 89/89]
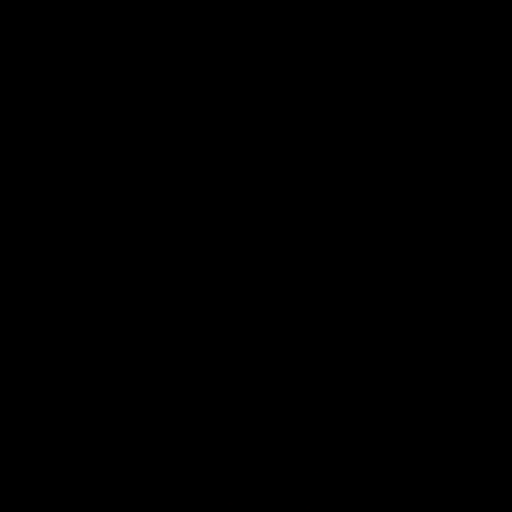

[Series 604: mip range 5 · coronal · 1.78mm/px · 1 of 32 slices shown]
[im 1/32]
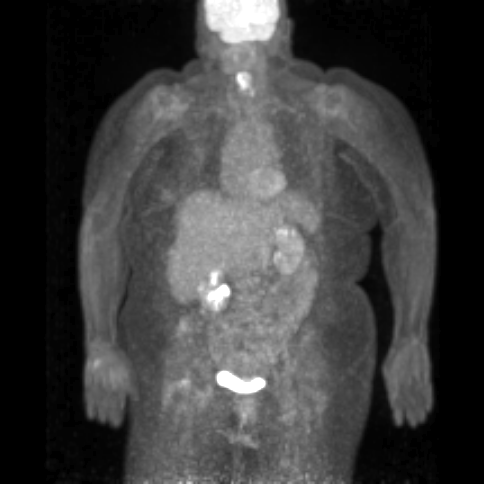

[Series 605: range-ct hn_sk_th 5.0 hd_fov-tra-<alpha range> · 5 of 207 slices shown]
[im 1/207]
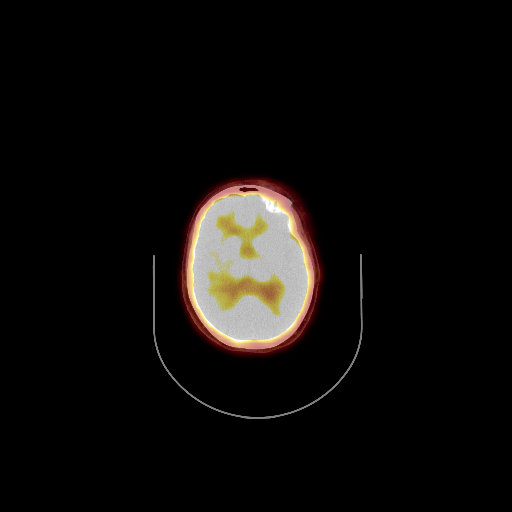
[im 52/207]
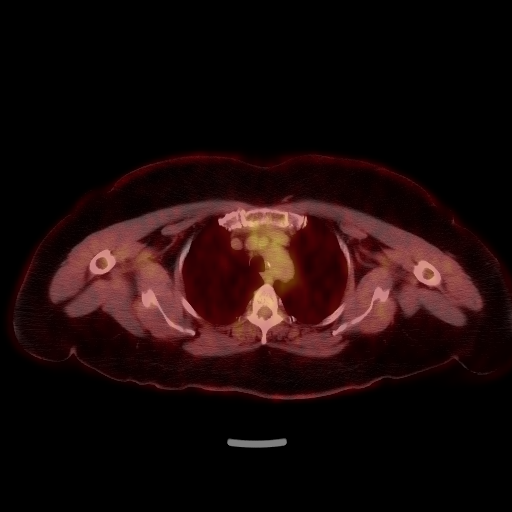
[im 104/207]
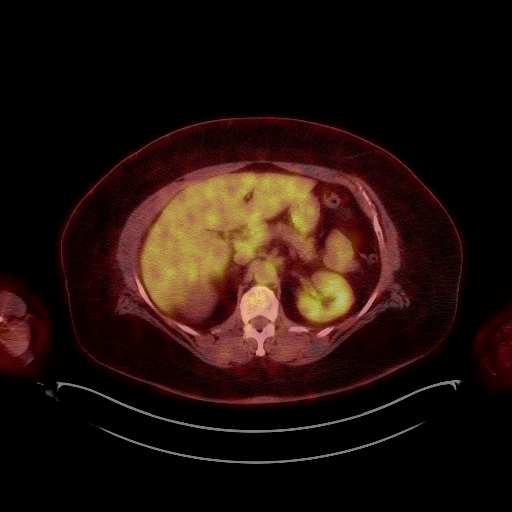
[im 155/207]
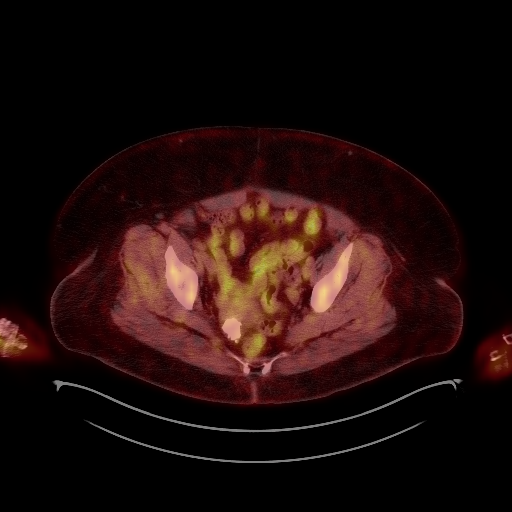
[im 207/207]
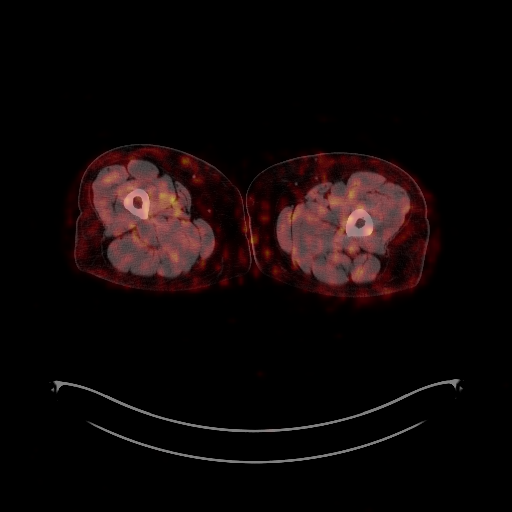

[25 of 25 positions shown; findings below may reference images not displayed]

FINDINGS: Mediastinal blood pool activity: SUV max

Liver activity: SUV max NA

NECK:

There is a hypermetabolic 2.3 cm supraglottic laryngeal mass
centered to the right of midline with max SUV 12.6 (series 4/image
37).

No enlarged or hypermetabolic lymph nodes in the neck.

Incidental CT findings: Non hypermetabolic hypodense bilateral
thyroid nodules, largest 1.5 cm on the left. Mucoperiosteal
thickening in the bilateral maxillary sinuses without fluid levels.

CHEST: No enlarged or hypermetabolic axillary, mediastinal or hilar
lymph nodes. No hypermetabolic pulmonary findings.

Incidental CT findings: Atherosclerotic nonaneurysmal thoracic
aorta. Mild paraseptal emphysema. No acute consolidative airspace
disease, lung masses or significant pulmonary nodules.

ABDOMEN/PELVIS: No abnormal hypermetabolic activity within the
liver, pancreas, adrenal glands, or spleen. No hypermetabolic lymph
nodes in the abdomen or pelvis.

Incidental CT findings: Cholelithiasis. Diffuse hepatic steatosis.
Multiple simple right renal cysts, largest 6.9 cm superiorly.
Atherosclerotic nonaneurysmal abdominal aorta. Mildly enlarged
myomatous uterus with coarsely calcified right fundal uterine
fibroid.

SKELETON: No focal hypermetabolic activity to suggest skeletal
metastasis.

Incidental CT findings: none
IMPRESSION: 1. Hypermetabolic 2.3 cm right supraglottic laryngeal mass
compatible with known primary malignancy.
2. No hypermetabolic neck lymph nodes or distant metastatic disease.
3. Chronic findings include: Aortic Atherosclerosis ([id number])
and Emphysema ([id number].R). Cholelithiasis. Diffuse hepatic
steatosis.

## 2021-11-29 ENCOUNTER — Encounter: Payer: Self-pay | Admitting: Radiation Oncology

## 2021-11-29 ENCOUNTER — Ambulatory Visit
Admission: RE | Admit: 2021-11-29 | Discharge: 2021-11-29 | Disposition: A | Discharge status: Home / Self Care | Payer: Medicare HMO | Source: Ambulatory Visit | Attending: Radiation Oncology | Admitting: Radiation Oncology

## 2021-11-29 ENCOUNTER — Other Ambulatory Visit: Payer: Self-pay

## 2021-11-29 VITALS — BP 149/89 | HR 88 | Temp 98.4°F | Wt 155.4 lb

## 2021-11-29 DIAGNOSIS — C321 Malignant neoplasm of supraglottis: Secondary | ICD-10-CM | POA: Diagnosis not present

## 2021-11-29 MED ORDER — OXYMETAZOLINE HCL 0.05 % NA SOLN
2.0000 | Freq: Once | NASAL | Status: AC
  Administered 2021-11-29: 2 via NASAL
  Filled 2021-11-29: qty 30

## 2021-11-29 NOTE — Progress Notes (Signed)
Oncology Nurse Navigator Documentation   I met with Sara Farmer before and during her follow up appointment with Dr. Basilio Cairo. She is feeling well. At Dr. Colletta Maryland request I have scheduled her to see Dr. Pollyann Kennedy on 7/17 at 3:30 pm. I will notify Sara Farmer via phone today.  Hedda Slade RN, BSN, OCN Head & Neck Oncology Nurse Navigator [facility] at [facility] Phone # [phone]  Fax # [phone]

## 2022-06-22 NOTE — Progress Notes (Signed)
Sara Farmer presents today for follow-up after completing radiation to her supraglottis on 04/21/2020.   Pain issues, if any: no pain Using a feeding tube?: never had one Weight changes, if any: gained weight Swallowing issues, if any: swallowing well, voice is hoarse still Smoking or chewing tobacco? Quit three years ago Using fluoride trays daily? no Last ENT visit was on:  Dr. Constance Farmer on 04-10-22 Physical Exam:  Healthy-appearing lady in no distress. Breathing and voice are clear. Slight raspiness to her voice. Ear canals are clean and dry. No buildup or infection. Intranasal exam is unremarkable. Oral cavity and pharynx are healthy and clear. Indirect laryngoscopy appears normal without any new masses. Cords move well. No palpable adenopathy.  Independent Review of Additional Tests or Records: none  Procedures: none  Impression & Plans: Stable, 3 years posttreatment, no evidence of recurrent disease. Recheck 4 months or sooner if needed.  Other notable issues, if any: no concerns this visit  Wt Readings from Last 3 Encounters:  07/04/22 173 lb 6.4 oz (78.7 kg)  11/29/21 155 lb 6.4 oz (70.5 kg)  05/03/21 187 lb 4 oz (84.9 kg)   Vitals:   07/04/22 1431  BP: (!) 144/88  Pulse: 67  Resp: 20  Temp: (!) 97.4 F (36.3 C)  SpO2: 100%

## 2022-07-04 ENCOUNTER — Ambulatory Visit
Admission: RE | Admit: 2022-07-04 | Discharge: 2022-07-04 | Disposition: A | Discharge status: Home / Self Care | Payer: Medicare HMO | Source: Ambulatory Visit | Attending: Radiation Oncology | Admitting: Radiation Oncology

## 2022-07-04 ENCOUNTER — Encounter: Payer: Self-pay | Admitting: Radiation Oncology

## 2022-07-04 VITALS — BP 144/88 | HR 67 | Temp 97.4°F | Resp 20 | Ht 60.0 in | Wt 173.4 lb

## 2022-07-04 DIAGNOSIS — Z923 Personal history of irradiation: Secondary | ICD-10-CM | POA: Diagnosis not present

## 2022-07-04 DIAGNOSIS — C321 Malignant neoplasm of supraglottis: Secondary | ICD-10-CM

## 2022-07-04 DIAGNOSIS — Z8521 Personal history of malignant neoplasm of larynx: Secondary | ICD-10-CM | POA: Insufficient documentation

## 2022-07-04 MED ORDER — OXYMETAZOLINE HCL 0.05 % NA SOLN
1.0000 | Freq: Once | NASAL | Status: AC
  Administered 2022-07-04: 1 via NASAL
  Filled 2022-07-04: qty 30

## 2022-07-04 NOTE — Progress Notes (Addendum)
Radiation Oncology         [phone] ________________________________  Name: CAMEREN ODWYER MRN: [id number]  Date: 07/04/2022  DOB: 01-Nov-1950  Follow-Up Visit Note  CC: Chesley Noon, MD  Chesley Noon, MD  Diagnosis and Prior Radiotherapy:       ICD-10-CM   1. 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 Word presents today for follow-up after completing radiation to her supraglottis on 04/21/2020  Pain issues, if any: no pain Using a feeding tube?: never had one Weight changes, if any: gained weight Wt Readings from Last 3 Encounters:  07/04/22 173 lb 6.4 oz (78.7 kg)  11/29/21 155 lb 6.4 oz (70.5 kg)  05/03/21 187 lb 4 oz (84.9 kg)  Swallowing issues, if any: swallowing well, voice is hoarse still Smoking or chewing tobacco? Quit three years ago Using fluoride trays daily? no Last ENT visit was on:  Dr. Constance Holster on 04-10-22 Physical Exam:  Healthy-appearing lady in no distress. Breathing and voice are clear. Slight raspiness to her voice. Ear canals are clean and dry. No buildup or infection. Intranasal exam is unremarkable. Oral cavity and pharynx are healthy and clear. Indirect laryngoscopy appears normal without any new masses. Cords move well. No palpable adenopathy.  Other notable issues, if any: No concerns this visit   ALLERGIES:  is allergic to penicillins and sulfa antibiotics.  Meds: Current Outpatient Medications  Medication Sig Dispense Refill   amLODipine (NORVASC) 10 MG tablet Take 10 mg by mouth daily.     buPROPion (WELLBUTRIN SR) 150 MG 12 hr tablet Start one week before quit  date. Take 1 tab daily x 3 days, then 1 tab BID thereafter. 60 tablet 2   Ibuprofen-diphenhydrAMINE HCl (ADVIL PM) 200-25 MG CAPS Take 1 tablet by mouth at bedtime as needed (Sleep).     levothyroxine (SYNTHROID) 75 MCG tablet Take 75 mcg by mouth daily.     PARoxetine (PAXIL) 40 MG tablet Take 40 mg by mouth daily.     simvastatin (ZOCOR) 40 MG tablet Take 40 mg by mouth at bedtime.     valsartan-hydrochlorothiazide (DIOVAN-HCT) 320-25 MG tablet Take 1 tablet by mouth daily.     albuterol (VENTOLIN HFA) 108 (90 Base) MCG/ACT inhaler Inhale 2 puffs into the lungs as needed. (Patient not taking: Reported on 02/25/2020)     nicotine (NICODERM CQ - DOSED IN MG/24 HOURS) 14 mg/24hr patch Apply 1 patch to skin daily for 6 weeks. (Patient not taking: Reported on 02/25/2020) 14 patch 2   nicotine (NICODERM CQ - DOSED IN MG/24 HR) 7 mg/24hr patch Apply 1 patch to skin daily for 2 weeks. Finish Rx for '14mg'$  patch, first. (Patient not taking: Reported on 02/25/2020) 14 patch 0   OZEMPIC, 0.25 OR 0.5 MG/DOSE, 2 MG/1.5ML SOPN Inject 0.5 mg into the skin once a week. (Patient not taking: Reported on 07/04/2022)     No current facility-administered medications for this encounter.    Physical Findings: The patient is in no acute distress. Patient is alert and oriented. Wt Readings from Last 3 Encounters:  07/04/22 173 lb 6.4 oz (78.7  kg)  11/29/21 155 lb 6.4 oz (70.5 kg)  05/03/21 187 lb 4 oz (84.9 kg)    height is 5' (1.524 m) and weight is 173 lb 6.4 oz (78.7 kg). Her temperature is 97.4 F (36.3 C) (abnormal). Her blood pressure is 144/88 (abnormal) and her pulse is 67. Her respiration is 20 and oxygen saturation is 100%. .  General: Alert and oriented, in no acute distress; hoarse HEENT: Head is normocephalic. Extraocular movements are intact. Oropharynx is notable for no lesions in the mouth or upper throat - partials removed. Neck: Neck is notable for no palpable adenopathy. Skin: Skin in treatment  fields shows satisfactory healing  Psychiatric: Judgment and insight are intact. Affect is appropriate. Heart RRR Chest CTAB  PROCEDURE NOTE: After obtaining consent and spraying nasal cavity with topical oxymetazoline, lidocaine gel was applied to the endoscope and the flexible endoscope was introduced and passed through the nasal cavity.  The nasopharynx, oropharynx, hypopharynx, and larynx  were then examined.  No masses visualized.  Symmetric swelling visualized in the supraglottis, consistent with postradiation changes. The true cords are symmetrically mobile.  Anterior right  true cord shows a small white patch that does not clear with coughing.  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 There are no other findings. She tolerated the procedure well   Lab Findings: Lab Results  Component Value Date   WBC 14.2 (H) 01/28/2020   HGB 15.8 (H) 01/28/2020   HCT 47.5 (H) 01/28/2020   MCV 87.0 01/28/2020   PLT 362 01/28/2020    Lab Results  Component Value Date   TSH 11.047 (H) 11/23/2020      Radiographic Findings: No results found.   Impression/Plan:    1) Head and Neck Cancer Status: She is likely cured.  I will see her back as needed.  She will continue to follow closely with Dr. Constance Holster (I sent a note to him to keep an eye on her right true vocal cord out of an abundance of caution)  2) Nutritional Status: Gained weight back  Wt Readings from Last 3 Encounters:  07/04/22 173 lb 6.4 oz (78.7 kg)  11/29/21 155 lb 6.4 oz (70.5 kg)  05/03/21 187 lb 4 oz (84.9 kg)   PEG tube: None  3) Risk Factors: The patient has been educated about risk factors including alcohol and tobacco abuse; they understand that avoidance of alcohol and tobacco is important to prevent recurrences as well as other cancers - she is 3 years cancer free - I applauded her for this  4) Swallowing: Good function, continue exercises from speech-language pathology  5) Dental:  Encouraged to continue regular followup with dentistry, and dental hygiene including fluoride rinses.   6) Thyroid function: She is taking levothyroxine.   Dr. Melford Aase is following lab work in his clinic and will address refills as appropriate  7)   Disposition: She will follow-up with otolaryngology closely. It was a pleasure taking part in this patient's care. She knows to call back with questions or concerns at any time.  On date of service, in total, I spent 30 minutes on this encounter. Patient was seen in person. _____________________________________   Leona Singleton, PA    Eppie Gibson, MD

## 2022-07-04 NOTE — Addendum Note (Signed)
Encounter addended by: Eppie Gibson, MD on: 07/04/2022 6:23 PM  Actions taken: Clinical Note Signed, Level of Service modified

## 2022-07-05 ENCOUNTER — Telehealth: Payer: Self-pay | Admitting: Licensed Clinical Social Worker

## 2022-07-05 NOTE — Progress Notes (Signed)
Oncology Nurse Navigator Documentation   Per patient's 06/24/22 post-treatment follow-up with Dr. Isidore Moos, sent fax to [facility] ENT Scheduling with request Ms. Doetsch be contacted and scheduled for routine post-RT follow-up with Dr. Constance Holster in 3 months.  Notification of successful fax transmission received.   Harlow Asa RN, BSN, OCN Head & Neck Oncology Nurse [place] at [facility] Phone # [phone]  Fax # [phone]

## 2022-07-05 NOTE — Telephone Encounter (Signed)
[facility] Work  Clinical Social Work was referred by nurse for assessment of needs related to house pests.  Clinical Social Worker attempted to contact patient by phone  to offer support and assess for needs.   No answer. VM full, unable to leave message.     [place], [place] Worker [employer]

## 2022-07-05 NOTE — Telephone Encounter (Signed)
Pt returned the call from this CSW. Stated that they had a problem with roaches previously but said it has been taken care of and they no longer have an issue with any pests at this time.  Referral closed.   Belladonna Lubinski E Izumi Mixon, LCSW
